# Patient Record
Sex: Female | Born: 1960
Health system: Southern US, Community
[De-identification: ages and names within clinical notes are randomized; demographics above are authoritative.]

## PROBLEM LIST (undated history)

## (undated) DIAGNOSIS — I2699 Other pulmonary embolism without acute cor pulmonale: Secondary | ICD-10-CM

## (undated) HISTORY — PX: ABDOMINAL HYSTERECTOMY: SHX81

---

## 2002-03-27 ENCOUNTER — Other Ambulatory Visit: Admission: RE | Admit: 2002-03-27 | Discharge: 2002-03-27 | Payer: Self-pay | Admitting: Obstetrics and Gynecology

## 2003-06-24 ENCOUNTER — Other Ambulatory Visit: Admission: RE | Admit: 2003-06-24 | Discharge: 2003-06-24 | Payer: Self-pay | Admitting: Obstetrics and Gynecology

## 2004-07-28 ENCOUNTER — Encounter (INDEPENDENT_AMBULATORY_CARE_PROVIDER_SITE_OTHER): Payer: Self-pay | Admitting: Specialist

## 2004-07-28 ENCOUNTER — Observation Stay (HOSPITAL_COMMUNITY): Admission: RE | Admit: 2004-07-28 | Discharge: 2004-07-29 | Payer: Self-pay | Admitting: Obstetrics and Gynecology

## 2004-10-16 ENCOUNTER — Encounter: Admission: RE | Admit: 2004-10-16 | Discharge: 2004-10-16 | Payer: Self-pay

## 2005-11-24 ENCOUNTER — Encounter: Admission: RE | Admit: 2005-11-24 | Discharge: 2005-11-24 | Payer: Self-pay | Admitting: Neurology

## 2006-01-19 ENCOUNTER — Emergency Department (HOSPITAL_COMMUNITY): Admission: EM | Admit: 2006-01-19 | Discharge: 2006-01-19 | Payer: Self-pay | Admitting: Emergency Medicine

## 2006-04-13 ENCOUNTER — Encounter (INDEPENDENT_AMBULATORY_CARE_PROVIDER_SITE_OTHER): Payer: Self-pay | Admitting: *Deleted

## 2006-04-13 ENCOUNTER — Ambulatory Visit (HOSPITAL_COMMUNITY): Admission: RE | Admit: 2006-04-13 | Discharge: 2006-04-13 | Payer: Self-pay | Admitting: *Deleted

## 2008-07-12 ENCOUNTER — Ambulatory Visit: Payer: Self-pay | Admitting: Sports Medicine

## 2008-07-12 DIAGNOSIS — M7750 Other enthesopathy of unspecified foot: Secondary | ICD-10-CM | POA: Insufficient documentation

## 2008-07-12 DIAGNOSIS — M2020 Hallux rigidus, unspecified foot: Secondary | ICD-10-CM | POA: Insufficient documentation

## 2008-07-15 ENCOUNTER — Encounter: Payer: Self-pay | Admitting: Sports Medicine

## 2008-07-31 ENCOUNTER — Ambulatory Visit: Payer: Self-pay | Admitting: Sports Medicine

## 2009-11-13 ENCOUNTER — Encounter: Admission: RE | Admit: 2009-11-13 | Discharge: 2009-11-13 | Payer: Self-pay | Admitting: Internal Medicine

## 2009-11-20 ENCOUNTER — Encounter: Admission: RE | Admit: 2009-11-20 | Discharge: 2009-11-20 | Payer: Self-pay | Admitting: Internal Medicine

## 2009-11-21 ENCOUNTER — Encounter: Admission: RE | Admit: 2009-11-21 | Discharge: 2009-11-21 | Payer: Self-pay | Admitting: Internal Medicine

## 2010-02-24 ENCOUNTER — Encounter: Admission: RE | Admit: 2010-02-24 | Discharge: 2010-02-24 | Payer: Self-pay | Admitting: Cardiovascular Disease

## 2010-08-16 ENCOUNTER — Encounter: Payer: Self-pay | Admitting: Internal Medicine

## 2010-12-11 NOTE — Discharge Summary (Signed)
Alicia Osborne, Alicia Osborne              ACCOUNT NO.:  [id number]   MEDICAL RECORD NO.:  [id number]          PATIENT TYPE:  OBV   LOCATION:  9305                          FACILITY:  [facility]   PHYSICIAN:  Guy Sandifer. Tomblin II, M.D.DATE OF BIRTH:  [birth year]   DATE OF ADMISSION:  07/28/2004  DATE OF DISCHARGE:  07/29/2004                                 DISCHARGE SUMMARY   ADMISSION DIAGNOSIS:  Menometrorrhagia.   DISCHARGE DIAGNOSIS:  Menometrorrhagia.   PROCEDURES:  On July 28, 2004, laparoscopically assisted vaginal  hysterectomy.   REASON FOR ADMISSION:  This patient is a 50 year old, married, white female,  G6, P3, with persistent heavy bleeding.  Details are dictated in the history  and physical.  She is admitted for surgical management.   HOSPITAL COURSE:  The patient is admitted to the hospital and undergoes the  above procedure.  On the evening of surgery, she is afebrile with stable  vital signs.  Urine output is clear.  She has good pain relief and had one  incidence of nausea and vomiting relieved with medication.  The abdomen is  soft and PAS hose are on.  She does receive Lovenox 40 mg daily as well.  On  the day of discharge, the white count is 9.2, hemoglobin 11.1 and platelet  count 199,000.  She is passing flatus, tolerating regular diet with good  pain relief and is ambulating well.  She remains afebrile and stable vital  signs.   CONDITION ON DISCHARGE:  Good.   DIET:  Regular as tolerated.   ACTIVITY:  No lifting.  No operation of automobiles.  No vaginal entry.   SPECIAL INSTRUCTIONS:  She is to call the office for problems, including,  but no limited to temperature of 101 degrees, persistent nausea and  vomiting, increasing pain or heavy vaginal bleeding.   DISCHARGE MEDICATIONS:  1.  Percocet 5/325 mg, #30, one to two p.o. q.6h. p.r.n.  2.  Ibuprofen 600 mg q.6h. p.r.n.  3.  Multivitamins daily.   FOLLOWUP:  Followup is in the office in two weeks.     Jame   JET/MEDQ  D:  07/29/2004  T:  07/29/2004  Job:  [id number]
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
## 2010-12-11 NOTE — Op Note (Signed)
NAMEANNE, BOLTZ                  ACCOUNT NO.:  [id number]   MEDICAL RECORD NO.:  [id number]          PATIENT TYPE:  AMB   LOCATION:  ENDO                         FACILITY:  [facility]   PHYSICIAN:  Georgiana Spinner, M.D.    DATE OF BIRTH:  06-28-1961   DATE OF PROCEDURE:  04/13/2006  DATE OF DISCHARGE:                                 OPERATIVE REPORT   PROCEDURE:  Colonoscopy.   INDICATIONS:  Rectal bleeding.   ANESTHESIA:  Fentanyl 100 mcg, Versed 15 mg, Phenergan 12.5 mg.   PROCEDURE:  With the patient mildly sedated in the left lateral decubitus  position, the Olympus videoscopic colonoscope was inserted into the rectum  and passed under direct vision to the cecum, identified by ileocecal valve  and appendiceal orifice both of which were photographed.  From this point,  the colonoscope was slowly withdrawn, taking circumferential views of  colonic mucosa as we withdrew all the way to the rectum.  We had seen a  polyp on the way in, and so we had to reinsert back to this level and  withdraw again, and we did find the polyp at the splenic flexure.  This was  photographed and was actually bleeding somewhat possibly due to the trauma  from the scope passage and suction, but nonetheless with washing, this  stopped and we were then able to ensnare this polyp, and using snare cautery  technique setting of 20/200 blended current, we removed the polyp and  suctioned it into the end of the endoscope and withdrew it to obtain the  pathology specimen.  The endoscope was then reinserted to this level and  withdrawn, taking circumferential views of the remaining colonic mucosa,  stopping once again in the rectum which appeared normal on direct and showed  hemorrhoids on retroflexed view.  The endoscope was straightened and  withdrawn.  The patient's vital signs and pulse oximeter remained stable.  The patient tolerated the procedure well and without apparent complication.   FINDINGS:  Polyp of the  splenic flexure, internal hemorrhoids otherwise  unremarkable exam.   PLAN:  Await biopsy report.  The patient will call me for results and follow-  up with me as an outpatient.           ______________________________  Georgiana Spinner, M.D.     GMO/MEDQ  D:  04/13/2006  T:  04/14/2006  Job:  [id number]

## 2010-12-11 NOTE — Op Note (Signed)
NAMEMOSSIE, GILDER              ACCOUNT NO.:  [id number]   MEDICAL RECORD NO.:  [id number]          PATIENT TYPE:  OBV   LOCATION:  9399                          FACILITY:  [facility]   PHYSICIAN:  Guy Sandifer. Tomblin II, M.D.DATE OF BIRTH:  Dec 23, 1960   DATE OF PROCEDURE:  07/28/2004  DATE OF DISCHARGE:                                 OPERATIVE REPORT   PREOPERATIVE DIAGNOSIS:  Menometrorrhagia.   POSTOPERATIVE DIAGNOSIS:  Menometrorrhagia.   PROCEDURE:  Laparoscopically assisted vaginal hysterectomy.   SURGEON:  Guy Sandifer. Henderson Cloud, M.D.   ASSISTANTStann Mainland. Vincente Poli, M.D.   ANESTHESIA:  General with endotracheal intubation.   ESTIMATED BLOOD LOSS:  100 cc.   SPECIMENS:  Uterus.   INDICATIONS AND CONSENT:  This patient is a 50 year old married white  female, G6, P3, with heavy bleeding.  Details are dictated in the history  and physical.  Laparoscopically assisted vaginal hysterectomy and removal a  tube and ovary only if distinctly abnormal has been discussed.  The  potential risks and complications were discussed preoperatively, including  but not limited to infection; bowel, bladder, ureteral damage; bleeding  requiring transfusion of blood products, possible transfusion reaction, HIV,  and hepatitis acquisition; DVT, PE, pneumonia; laparotomy; fistula  formation; and postoperative dyspareunia and pain.  All questions have been  answered, and consent is signed on the chart.   FINDINGS:  The upper abdomen is grossly normal.  The uterus is about six  weeks in size.  Anterior and posterior cul-de-sacs are normal.  The tubes  and ovaries are normal.   DESCRIPTION OF PROCEDURE:  The patient is taken to the operating room where  she is identified and placed in the dorsal supine position, and general  anesthesia is induced via endotracheal intubation.  She is then placed in  the dorsal lithotomy position where she is prepped abdominally and  vaginally.  The bladder is straight  catheterized.  A Hulka tenaculum is  placed in the uterus as a manipulator, and she is draped in a sterile  fashion.   A small infraumbilical incision is made, and a disposable Veress needle is  placed.  Syringe and drop test are normal.  Two liters of gas are  insufflated under low pressure.  Good tympany is noted in the right upper  quadrant.  The Veress needle is removed and is replaced with a 10/11  disposable trocar sleeve.  Placement is verified with the laparoscope, and  no damage to the surrounding structures is noted.  A small suprapubic  incision is made in the midline, and a 5-mm disposable trocar sleeve is  placed under direct visualization without difficulty.  The above findings  are noted.  The proximal ligaments are taken down bilaterally to the level  of the vesicouterine peritoneum with the Gyrus bipolar cautery cutting  instrument.  Excellent hemostasis is maintained.  The vesicouterine  peritoneum is incised in the midline, hydrodissected, and taken down  cephalolaterally.  The instruments are removed.  The suprapubic trocar  sleeve is removed, and attention is turned to the vagina.   The posterior cul-de-sac is entered  sharply, and the cervix is circumscribed  with unipolar cautery.  The mucosa is advanced sharply and bluntly.  The  uterosacral ligaments are taken bilaterally with the Gyrus bipolar  instrument.  The anterior cul-de-sac is entered without difficulty.  Progressive bites of the bladder pillars, cardinal ligaments, and uterine  vessels are taken bilaterally.  The fundus is delivered posteriorly.  The  proximal ligaments are taken down, and the specimen is delivered.  The  uterosacral ligaments are plicated to the vaginal cuff bilaterally with 0  Monocryl suture.  They are then reapproximated in the midline with a  separate suture.  The cuff is closed with figure-of-eight sutures.  A Foley  catheter is placed in the bladder as a drain, and clear urine is  noted.  Attention is returned to the abdomen.   Pneumoperitoneum is reintroduced, and under direct visualization, the  suprapubic trocar sleeve is reintroduced.  Inspection and thoroughly  irrigation reveals minor bleeding at peritoneal edges which is controlled  easily with bipolar cautery.  Excess fluid is removed.  Inspection under  reduced pneumoperitoneum reveals complete hemostasis.  All instruments are  removed.  A 0 Vicryl is used to pull together the subcutaneous tissue and  the umbilical incision.  Both incisions are then injected with 0.5% plain  Marcaine, and Dermabond is used to close the skin on both incisions.  All  counts are correct.   The patient is awakened and taken to the recovery room in stable condition.     Jame   JET/MEDQ  D:  07/28/2004  T:  07/28/2004  Job:  [id number]

## 2011-07-06 ENCOUNTER — Ambulatory Visit (INDEPENDENT_AMBULATORY_CARE_PROVIDER_SITE_OTHER): Payer: 59 | Admitting: Sports Medicine

## 2011-07-06 DIAGNOSIS — M2020 Hallux rigidus, unspecified foot: Secondary | ICD-10-CM

## 2011-07-06 DIAGNOSIS — M7750 Other enthesopathy of unspecified foot: Secondary | ICD-10-CM

## 2011-07-06 NOTE — Progress Notes (Signed)
  Subjective:    Patient ID: Alicia Osborne, female    DOB: 04-Mar-1961, 50 y.o.   MRN: [id number]  HPI Pt came in today needing more green insoles for her shoes. The insoles we gave previously helped out a lot and pt is back running and walk before her previous foot pain issue. Also placed MT pads on several of her everyday dress shoes per her request.    Review of Systems     Objective:   Physical Exam        Assessment & Plan:

## 2011-07-12 ENCOUNTER — Encounter: Payer: Self-pay | Admitting: Emergency Medicine

## 2011-07-12 ENCOUNTER — Emergency Department (HOSPITAL_COMMUNITY)
Admission: EM | Admit: 2011-07-12 | Discharge: 2011-07-12 | Disposition: A | Discharge status: Home / Self Care | Payer: 59 | Attending: Emergency Medicine | Admitting: Emergency Medicine

## 2011-07-12 ENCOUNTER — Emergency Department (HOSPITAL_COMMUNITY): Payer: 59

## 2011-07-12 DIAGNOSIS — S139XXA Sprain of joints and ligaments of unspecified parts of neck, initial encounter: Secondary | ICD-10-CM | POA: Insufficient documentation

## 2011-07-12 DIAGNOSIS — S161XXA Strain of muscle, fascia and tendon at neck level, initial encounter: Secondary | ICD-10-CM

## 2011-07-12 DIAGNOSIS — R42 Dizziness and giddiness: Secondary | ICD-10-CM | POA: Insufficient documentation

## 2011-07-12 DIAGNOSIS — M542 Cervicalgia: Secondary | ICD-10-CM | POA: Insufficient documentation

## 2011-07-12 DIAGNOSIS — Z86718 Personal history of other venous thrombosis and embolism: Secondary | ICD-10-CM | POA: Insufficient documentation

## 2011-07-12 DIAGNOSIS — X58XXXA Exposure to other specified factors, initial encounter: Secondary | ICD-10-CM | POA: Insufficient documentation

## 2011-07-12 DIAGNOSIS — Z7901 Long term (current) use of anticoagulants: Secondary | ICD-10-CM | POA: Insufficient documentation

## 2011-07-12 DIAGNOSIS — M5020 Other cervical disc displacement, unspecified cervical region: Secondary | ICD-10-CM

## 2011-07-12 HISTORY — DX: Other pulmonary embolism without acute cor pulmonale: I26.99

## 2011-07-12 MED ORDER — DEXAMETHASONE SODIUM PHOSPHATE 10 MG/ML IJ SOLN
10.0000 mg | Freq: Once | INTRAMUSCULAR | Status: AC
  Administered 2011-07-12: 10 mg via INTRAMUSCULAR
  Filled 2011-07-12: qty 1

## 2011-07-12 MED ORDER — OXYCODONE-ACETAMINOPHEN 5-325 MG PO TABS: 2.0000 | ORAL_TABLET | Freq: Four times a day (QID) | ORAL | Status: AC | PRN

## 2011-07-12 MED ORDER — CYCLOBENZAPRINE HCL 10 MG PO TABS: 10.0000 mg | ORAL_TABLET | Freq: Two times a day (BID) | ORAL | Status: AC | PRN

## 2011-07-12 NOTE — ED Notes (Signed)
Patient transported to CT 

## 2011-07-12 NOTE — ED Notes (Signed)
Pt states pain in neck onset last pm, denies injury or heavy lifting, pt states was worse this am, and had near syncopal episode this am from the pain after lifting arms to reach in cabinet.

## 2011-07-12 NOTE — ED Provider Notes (Signed)
History     CSN: [id number] Arrival date & time: 07/12/2011  8:10 AM   First MD Initiated Contact with Patient 07/12/11 0825      Chief Complaint  Patient presents with  . Neck Pain    (Consider location/radiation/quality/duration/timing/severity/associated sxs/prior treatment) HPI Comments: Pt woke up this am with severe pain to left side of neck.  Worse with movement of head.  No radiation down arms.  No numbness/tingling/weakness to arms.  Went to get some tylenol and when she lifted her arms/head up to open the cabinet, got dizzy/faint feeling.  No syncope.  Did not hit her head.  No recent injury/trauma.  No CP/SOB/dizziness (other than one episode when she lifted her arms up).  No recent illnesses.  Is on coumadin for DVT/PE.  Patient is a 50 y.o. female presenting with neck pain. The history is provided by the patient.  Neck Pain  This is a new problem. The current episode started 3 to 5 hours ago. The problem occurs constantly. Pertinent negatives include no chest pain, no numbness, no headaches and no weakness.    Past Medical History  Diagnosis Date  . Pulmonary embolism     Past Surgical History  Procedure Date  . Cesarean section   . Abdominal hysterectomy     History reviewed. No pertinent family history.  History  Substance Use Topics  . Smoking status: Not on file  . Smokeless tobacco: Not on file  . Alcohol Use: Yes    OB History    Grav Para Term Preterm Abortions TAB SAB Ect Mult Living                  Review of Systems  Constitutional: Negative for fever, chills, diaphoresis and fatigue.  HENT: Positive for neck pain. Negative for congestion, rhinorrhea and sneezing.   Eyes: Negative.   Respiratory: Negative for cough, chest tightness and shortness of breath.   Cardiovascular: Negative for chest pain and leg swelling.  Gastrointestinal: Negative for nausea, vomiting, abdominal pain, diarrhea and blood in stool.  Genitourinary: Negative for  frequency, hematuria, flank pain and difficulty urinating.  Musculoskeletal: Negative for back pain and arthralgias.  Skin: Negative for rash.  Neurological: Positive for light-headedness. Negative for dizziness, speech difficulty, weakness, numbness and headaches.    Allergies  Aspirin; Hydromorphone hcl; and Ibuprofen  Home Medications   Current Outpatient Rx  Name Route Sig Dispense Refill  . ACETAMINOPHEN 500 MG PO TABS Oral Take 1,000 mg by mouth every 6 (six) hours as needed. For pain.     Marland Kitchen HYDROCHLOROTHIAZIDE 50 MG PO TABS Oral Take 50 mg by mouth daily.      . WARFARIN SODIUM 6 MG PO TABS Oral Take 6 mg by mouth daily.      . CYCLOBENZAPRINE HCL 10 MG PO TABS Oral Take 1 tablet (10 mg total) by mouth 2 (two) times daily as needed for muscle spasms. 20 tablet 0  . OXYCODONE-ACETAMINOPHEN 5-325 MG PO TABS Oral Take 2 tablets by mouth every 6 (six) hours as needed for pain. 30 tablet 0    BP 113/81  Pulse 94  Temp(Src) 97.8 F (36.6 C) (Oral)  Resp 16  SpO2 100%  Physical Exam  Constitutional: She is oriented to person, place, and time. She appears well-developed and well-nourished.  HENT:  Head: Normocephalic and atraumatic.  Eyes: Pupils are equal, round, and reactive to light.  Neck:       No pain over cervical spine.  Point  tenderness over left lateral C2/C3 area and along left trapezius  Cardiovascular: Normal rate, regular rhythm and normal heart sounds.   Pulmonary/Chest: Effort normal and breath sounds normal. No respiratory distress. She has no wheezes. She has no rales. She exhibits no tenderness.  Abdominal: Soft. Bowel sounds are normal. There is no tenderness. There is no rebound and no guarding.  Musculoskeletal: Normal range of motion. She exhibits no edema.  Lymphadenopathy:    She has no cervical adenopathy.  Neurological: She is alert and oriented to person, place, and time. She has normal strength. No cranial nerve deficit or sensory deficit.  Coordination normal.  Skin: Skin is warm and dry. No rash noted.  Psychiatric: She has a normal mood and affect.    ED Course  Procedures (including critical care time)  Labs Reviewed - No data to display Mr Cervical Spine Wo Contrast  07/12/2011  *RADIOLOGY REPORT*  Clinical Data: Neck pain and stiffness.  MRI CERVICAL SPINE WITHOUT CONTRAST  Technique:  Multiplanar and multiecho pulse sequences of the cervical spine, to include the craniocervical junction and cervicothoracic junction, were obtained according to standard protocol without intravenous contrast.  Comparison: None.  Findings: The craniocervical junction appears unremarkable.  No significant vertebral subluxation.  Degenerative endplate findings at C4-5 noted.  There is also mild loss of disc height that this level.  Inversion recovery weighted images demonstrate no significant abnormal vertebral or periligamentous edema.  Additional findings at individual levels are as follows:  C2-3:  Unremarkable.  C3-4:  Minimal disc bulge.  No impingement.  C4-5:  Diffuse disc bulge noted with bilateral uncinate spurring. There is borderline left foraminal stenosis. The AP diameter of the thecal sac is 10 mm, borderline for central stenosis.  C5-6:  Mild disc bulge noted with bilateral uncinate spurring resulting in moderate bilateral foraminal stenosis. The AP diameter of the thecal sac is 9 mm, compatible with mild central stenosis.  C6-7:  Broad left paracentral disc protrusion noted, with borderline left foraminal stenosis.  C7-T1:  Unremarkable.  IMPRESSION:  1.  Cervical spondylosis and degenerative disc disease, with impingement at the C5-6 level.  Original Report Authenticated By: Dellia Cloud, M.D.     1. Neck strain   2. HNP (herniated nucleus pulposus), cervical    Discussed with her husband who is a physician, will check MRI No results found for this or any previous visit. Mr Cervical Spine Wo Contrast  07/12/2011   *RADIOLOGY REPORT*  Clinical Data: Neck pain and stiffness.  MRI CERVICAL SPINE WITHOUT CONTRAST  Technique:  Multiplanar and multiecho pulse sequences of the cervical spine, to include the craniocervical junction and cervicothoracic junction, were obtained according to standard protocol without intravenous contrast.  Comparison: None.  Findings: The craniocervical junction appears unremarkable.  No significant vertebral subluxation.  Degenerative endplate findings at C4-5 noted.  There is also mild loss of disc height that this level.  Inversion recovery weighted images demonstrate no significant abnormal vertebral or periligamentous edema.  Additional findings at individual levels are as follows:  C2-3:  Unremarkable.  C3-4:  Minimal disc bulge.  No impingement.  C4-5:  Diffuse disc bulge noted with bilateral uncinate spurring. There is borderline left foraminal stenosis. The AP diameter of the thecal sac is 10 mm, borderline for central stenosis.  C5-6:  Mild disc bulge noted with bilateral uncinate spurring resulting in moderate bilateral foraminal stenosis. The AP diameter of the thecal sac is 9 mm, compatible with mild central stenosis.  C6-7:  Broad left paracentral disc protrusion noted, with borderline left foraminal stenosis.  C7-T1:  Unremarkable.  IMPRESSION:  1.  Cervical spondylosis and degenerative disc disease, with impingement at the C5-6 level.  Original Report Authenticated By: Dellia Cloud, M.D.      MDM  Sound MS, no neuro deficits, no recent trauma.  Feel that near syncope was likely pain related.  No other symptoms to suggest cardiac/other etiology        Rolan Bucco, MD 07/12/11 1058

## 2011-07-12 NOTE — ED Notes (Signed)
Pt returned from MRI NAD.

## 2012-03-14 ENCOUNTER — Other Ambulatory Visit: Payer: Self-pay | Admitting: Obstetrics and Gynecology

## 2013-05-03 ENCOUNTER — Telehealth: Payer: Self-pay | Admitting: Oncology

## 2013-05-03 NOTE — Telephone Encounter (Signed)
REFERRAL FORWARD TO MD FOR REVIEW.

## 2013-05-11 ENCOUNTER — Ambulatory Visit: Payer: 59

## 2013-05-11 ENCOUNTER — Telehealth: Payer: Self-pay | Admitting: Oncology

## 2013-05-11 ENCOUNTER — Encounter: Payer: 59 | Admitting: Oncology
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
## 2013-05-11 NOTE — Telephone Encounter (Signed)
C/D 05/11/13 for appt. 06/06/13

## 2013-06-06 ENCOUNTER — Encounter: Payer: 59 | Admitting: Oncology

## 2013-06-06 ENCOUNTER — Ambulatory Visit: Payer: 59

## 2013-06-20 ENCOUNTER — Other Ambulatory Visit: Payer: Self-pay | Admitting: Gastroenterology

## 2013-09-10 ENCOUNTER — Ambulatory Visit
Admission: RE | Admit: 2013-09-10 | Discharge: 2013-09-10 | Disposition: A | Discharge status: Home / Self Care | Payer: BC Managed Care – PPO | Source: Ambulatory Visit | Attending: Internal Medicine | Admitting: Internal Medicine

## 2013-09-10 ENCOUNTER — Other Ambulatory Visit: Payer: Self-pay | Admitting: Internal Medicine

## 2013-09-10 DIAGNOSIS — S0990XA Unspecified injury of head, initial encounter: Secondary | ICD-10-CM

## 2014-09-11 ENCOUNTER — Other Ambulatory Visit: Payer: Self-pay | Admitting: Obstetrics and Gynecology

## 2014-09-12 LAB — CYTOLOGY - PAP

## 2017-04-01 DIAGNOSIS — H04222 Epiphora due to insufficient drainage, left lacrimal gland: Secondary | ICD-10-CM | POA: Diagnosis not present

## 2017-04-01 DIAGNOSIS — H16103 Unspecified superficial keratitis, bilateral: Secondary | ICD-10-CM | POA: Diagnosis not present

## 2017-04-01 DIAGNOSIS — H04123 Dry eye syndrome of bilateral lacrimal glands: Secondary | ICD-10-CM | POA: Diagnosis not present

## 2017-04-01 DIAGNOSIS — H10413 Chronic giant papillary conjunctivitis, bilateral: Secondary | ICD-10-CM | POA: Diagnosis not present

## 2017-04-14 DIAGNOSIS — H01115 Allergic dermatitis of left lower eyelid: Secondary | ICD-10-CM | POA: Diagnosis not present

## 2017-04-14 DIAGNOSIS — H04222 Epiphora due to insufficient drainage, left lacrimal gland: Secondary | ICD-10-CM | POA: Diagnosis not present

## 2017-04-14 DIAGNOSIS — H10412 Chronic giant papillary conjunctivitis, left eye: Secondary | ICD-10-CM | POA: Diagnosis not present

## 2017-07-05 DIAGNOSIS — L258 Unspecified contact dermatitis due to other agents: Secondary | ICD-10-CM | POA: Diagnosis not present

## 2017-08-08 DIAGNOSIS — Z1231 Encounter for screening mammogram for malignant neoplasm of breast: Secondary | ICD-10-CM | POA: Diagnosis not present

## 2017-08-08 DIAGNOSIS — Z6827 Body mass index (BMI) 27.0-27.9, adult: Secondary | ICD-10-CM | POA: Diagnosis not present

## 2017-08-08 DIAGNOSIS — Z01419 Encounter for gynecological examination (general) (routine) without abnormal findings: Secondary | ICD-10-CM | POA: Diagnosis not present

## 2017-11-10 DIAGNOSIS — H04123 Dry eye syndrome of bilateral lacrimal glands: Secondary | ICD-10-CM | POA: Diagnosis not present

## 2017-11-10 DIAGNOSIS — H04412 Chronic dacryocystitis of left lacrimal passage: Secondary | ICD-10-CM | POA: Diagnosis not present

## 2017-12-01 DIAGNOSIS — B308 Other viral conjunctivitis: Secondary | ICD-10-CM | POA: Diagnosis not present

## 2017-12-12 DIAGNOSIS — H01025 Squamous blepharitis left lower eyelid: Secondary | ICD-10-CM | POA: Diagnosis not present

## 2017-12-12 DIAGNOSIS — H01022 Squamous blepharitis right lower eyelid: Secondary | ICD-10-CM | POA: Diagnosis not present

## 2017-12-12 DIAGNOSIS — H01021 Squamous blepharitis right upper eyelid: Secondary | ICD-10-CM | POA: Diagnosis not present

## 2017-12-12 DIAGNOSIS — H01024 Squamous blepharitis left upper eyelid: Secondary | ICD-10-CM | POA: Diagnosis not present

## 2018-01-16 DIAGNOSIS — H01021 Squamous blepharitis right upper eyelid: Secondary | ICD-10-CM | POA: Diagnosis not present

## 2018-01-16 DIAGNOSIS — H01025 Squamous blepharitis left lower eyelid: Secondary | ICD-10-CM | POA: Diagnosis not present

## 2018-01-16 DIAGNOSIS — H01024 Squamous blepharitis left upper eyelid: Secondary | ICD-10-CM | POA: Diagnosis not present

## 2018-01-16 DIAGNOSIS — H01022 Squamous blepharitis right lower eyelid: Secondary | ICD-10-CM | POA: Diagnosis not present

## 2018-02-04 ENCOUNTER — Ambulatory Visit (INDEPENDENT_AMBULATORY_CARE_PROVIDER_SITE_OTHER): Payer: BLUE CROSS/BLUE SHIELD

## 2018-02-04 ENCOUNTER — Encounter (HOSPITAL_COMMUNITY): Payer: Self-pay | Admitting: Emergency Medicine

## 2018-02-04 ENCOUNTER — Ambulatory Visit (HOSPITAL_COMMUNITY)
Admission: EM | Admit: 2018-02-04 | Discharge: 2018-02-04 | Disposition: A | Discharge status: Home / Self Care | Payer: BLUE CROSS/BLUE SHIELD | Attending: Internal Medicine | Admitting: Internal Medicine

## 2018-02-04 DIAGNOSIS — M79671 Pain in right foot: Secondary | ICD-10-CM

## 2018-02-04 DIAGNOSIS — S92351A Displaced fracture of fifth metatarsal bone, right foot, initial encounter for closed fracture: Secondary | ICD-10-CM | POA: Diagnosis not present

## 2018-02-04 DIAGNOSIS — S92354A Nondisplaced fracture of fifth metatarsal bone, right foot, initial encounter for closed fracture: Secondary | ICD-10-CM | POA: Diagnosis not present

## 2018-02-04 MED ORDER — NAPROXEN 500 MG PO TABS: 500.0000 mg | ORAL_TABLET | Freq: Two times a day (BID) | ORAL | 0 refills | Status: AC

## 2018-02-04 NOTE — Discharge Instructions (Signed)
Rest elevate and ice to affected extremity

## 2018-02-04 NOTE — ED Provider Notes (Addendum)
[facility]    CSN: [id number] Arrival date & time: 02/04/18  1243     History   Chief Complaint Chief Complaint  Patient presents with  . Foot Pain    HPI Alicia Osborne is a 57 y.o. female.   57 y.o. female presents with injuries to right foot that occurred today when she missed a step on a ladder. Patient denies any additional injures. Foot is warm dry cap refill, < 2 swelling noted to dorsal aspect of left foot and lateral aspect of foot right foor Ice applied to affected extemity Condition is acute in nature. Condition is made better by nothing. Condition is made worse by weight bearing activity. Patient denies any treatment prior to there arrival at this facility. Patient denies narcotic pain medication or crutch prescription at this time.       Past Medical History:  Diagnosis Date  . Pulmonary embolism [facility])     Patient Active Problem List   Diagnosis Date Noted  . METATARSALGIA 07/12/2008  . HALLUX RIGIDUS 07/12/2008    Past Surgical History:  Procedure Laterality Date  . ABDOMINAL HYSTERECTOMY    . CESAREAN SECTION      OB History   None      Home Medications    Prior to Admission medications   Medication Sig Start Date End Date Taking? Authorizing Provider  hydrochlorothiazide (HYDRODIURIL) 50 MG tablet Take 50 mg by mouth daily.     Yes [provider]  acetaminophen (TYLENOL) 500 MG tablet Take 1,000 mg by mouth every 6 (six) hours as needed. For pain.     [provider]  warfarin (COUMADIN) 6 MG tablet Take 6 mg by mouth daily.      [provider]    Family History No family history on file.  Social History Social History   Tobacco Use  . Smoking status: Not on file  Substance Use Topics  . Alcohol use: Yes  . Drug use: No     Allergies   Aspirin; Hydromorphone hcl; and Ibuprofen   Review of Systems Review of Systems  Constitutional: Negative for chills and fever.  HENT: Negative  for ear pain and sore throat.   Eyes: Negative for pain and visual disturbance.  Respiratory: Negative for cough and shortness of breath.   Cardiovascular: Negative for chest pain and palpitations.  Gastrointestinal: Negative for abdominal pain and vomiting.  Genitourinary: Negative for dysuria and hematuria.  Musculoskeletal: Negative for arthralgias and back pain.       Pain to right foot.  Skin: Negative for color change and rash.  Neurological: Negative for seizures and syncope.  All other systems reviewed and are negative.    Physical Exam Triage Vital Signs ED Triage Vitals [02/04/18 1403]  Enc Vitals Group     BP (!) 163/93     Pulse Rate (!) 111     Resp 18     Temp 98 F (36.7 C)     Temp src      SpO2 100 %     Weight      Height      Head Circumference      Peak Flow      Pain Score 10     Pain Loc      Pain Edu?      Excl. in GC?    No data found.  Updated Vital Signs BP (!) 163/93   Pulse (!) 111   Temp 98  F (36.7 C)   Resp 18   SpO2 100%   Visual Acuity Right Eye Distance:   Left Eye Distance:   Bilateral Distance:    Right Eye Near:   Left Eye Near:    Bilateral Near:     Physical Exam  Constitutional: She is oriented to person, place, and time. She appears well-developed and well-nourished.  HENT:  Head: Normocephalic and atraumatic.  Eyes: Conjunctivae are normal.  Neck: Normal range of motion.  Pulmonary/Chest: Effort normal.  Musculoskeletal: She exhibits edema ( to right foot) and tenderness.  Neurological: She is alert and oriented to person, place, and time.  Skin: Skin is warm and dry. No erythema.  Psychiatric: She has a normal mood and affect.  Nursing note and vitals reviewed.    UC Treatments / Results  Labs (all labs ordered are listed, but only abnormal results are displayed) Labs Reviewed - No data to display  EKG None  Radiology Dg Ankle Complete Right  Result Date: 02/04/2018 CLINICAL DATA:  Patient states  that she fell from step ladder today injuring her right foot and ankle. Pain along lateral side of foot and lateral side of ankle, pain and swelling. Hx of right foot fractures EXAM: RIGHT ANKLE - COMPLETE 3+ VIEW COMPARISON:  None. FINDINGS: Small slivers of bone are noted along the inferior margin of the distal fibula, which may reflect acute avulsion fractures be chronic. There is a transverse fracture across the base of the fifth metatarsal, acute. No other evidence of a fracture. The ankle mortise is normally spaced and aligned. Mild lateral soft tissue swelling. IMPRESSION: 1. Acute fracture at the base of the fifth metatarsal. 2. Possible acute small avulsion fractures from the inferior margin of the distal fibula, versus chronic changes. Electronically Signed   By: Amie Portlandavid  Ormond M.D.   On: 02/04/2018 14:53   Dg Foot Complete Right  Result Date: 02/04/2018 CLINICAL DATA:  Patient states that she fell from step ladder today injuring her right foot and ankle. Pain along lateral side of foot and lateral side of ankle, pain and swelling. Hx of right foot fractures EXAM: RIGHT FOOT COMPLETE - 3+ VIEW COMPARISON:  None. FINDINGS: There is an acute fracture at the base of the fifth metatarsal. There is a transverse fracture that extends across the metaphysis. A secondary fracture line extends to the articular surface with the cuboid. There is no fracture displacement or angulation. Deformity of the distal shaft of the fifth metatarsal reflects an old, healed fracture. No other fractures. Mild first metatarsophalangeal joint osteoarthritis. Mild lateral soft tissue swelling. IMPRESSION: Acute nondisplaced fracture at the base of the right fifth metatarsal as described. No dislocation. Electronically Signed   By: Amie Portlandavid  Ormond M.D.   On: 02/04/2018 14:54    Procedures Procedures (including critical care time)  Medications Ordered in UC Medications - No data to display  Initial Impression / Assessment and  Plan / UC Course  I have reviewed the triage vital signs and the nursing notes.  Pertinent labs & imaging results that were available during my care of the patient were reviewed by me and considered in my medical decision making (see chart for details).      Final Clinical Impressions(s) / UC Diagnoses   Final diagnoses:  None   Discharge Instructions   None    ED Prescriptions    None     Controlled Substance Prescriptions [place] Controlled Substance Registry consulted? Not Applicable   Alene Miresmohundro, Taelor Moncada C, NP  02/04/18 1531    Alene Mires, NP 02/04/18 [phone]

## 2018-02-04 NOTE — ED Triage Notes (Signed)
Pt misstepped off the ladder today, twisted the bottom of her R foot and ankle.

## 2018-03-30 DIAGNOSIS — Z23 Encounter for immunization: Secondary | ICD-10-CM | POA: Diagnosis not present

## 2018-04-03 DIAGNOSIS — S93601A Unspecified sprain of right foot, initial encounter: Secondary | ICD-10-CM | POA: Diagnosis not present

## 2018-06-07 DIAGNOSIS — Z1212 Encounter for screening for malignant neoplasm of rectum: Secondary | ICD-10-CM | POA: Diagnosis not present

## 2018-06-07 DIAGNOSIS — Z1211 Encounter for screening for malignant neoplasm of colon: Secondary | ICD-10-CM | POA: Diagnosis not present

## 2018-07-07 DIAGNOSIS — R2230 Localized swelling, mass and lump, unspecified upper limb: Secondary | ICD-10-CM | POA: Diagnosis not present

## 2018-08-24 DIAGNOSIS — R05 Cough: Secondary | ICD-10-CM | POA: Diagnosis not present

## 2018-08-24 DIAGNOSIS — K219 Gastro-esophageal reflux disease without esophagitis: Secondary | ICD-10-CM | POA: Diagnosis not present

## 2018-08-24 DIAGNOSIS — R9389 Abnormal findings on diagnostic imaging of other specified body structures: Secondary | ICD-10-CM | POA: Diagnosis not present

## 2018-08-25 DIAGNOSIS — R05 Cough: Secondary | ICD-10-CM | POA: Diagnosis not present

## 2018-12-05 DIAGNOSIS — Z0000 Encounter for general adult medical examination without abnormal findings: Secondary | ICD-10-CM | POA: Diagnosis not present

## 2018-12-07 DIAGNOSIS — K219 Gastro-esophageal reflux disease without esophagitis: Secondary | ICD-10-CM | POA: Diagnosis not present

## 2018-12-07 DIAGNOSIS — Z780 Asymptomatic menopausal state: Secondary | ICD-10-CM | POA: Diagnosis not present

## 2018-12-07 DIAGNOSIS — Z86711 Personal history of pulmonary embolism: Secondary | ICD-10-CM | POA: Diagnosis not present

## 2018-12-07 DIAGNOSIS — Z0000 Encounter for general adult medical examination without abnormal findings: Secondary | ICD-10-CM | POA: Diagnosis not present

## 2019-02-05 IMAGING — DX DG FOOT COMPLETE 3+V*R*
3 series · 3 of 3 positions shown · non-contrast
Comparison: None.

CLINICAL DATA: Patient states that she fell from step ladder today
injuring her right foot and ankle. Pain along lateral side of foot
and lateral side of ankle, pain and swelling. Hx of right foot
fractures

EXAM:
RIGHT FOOT COMPLETE - 3+ VIEW

[foot ap]
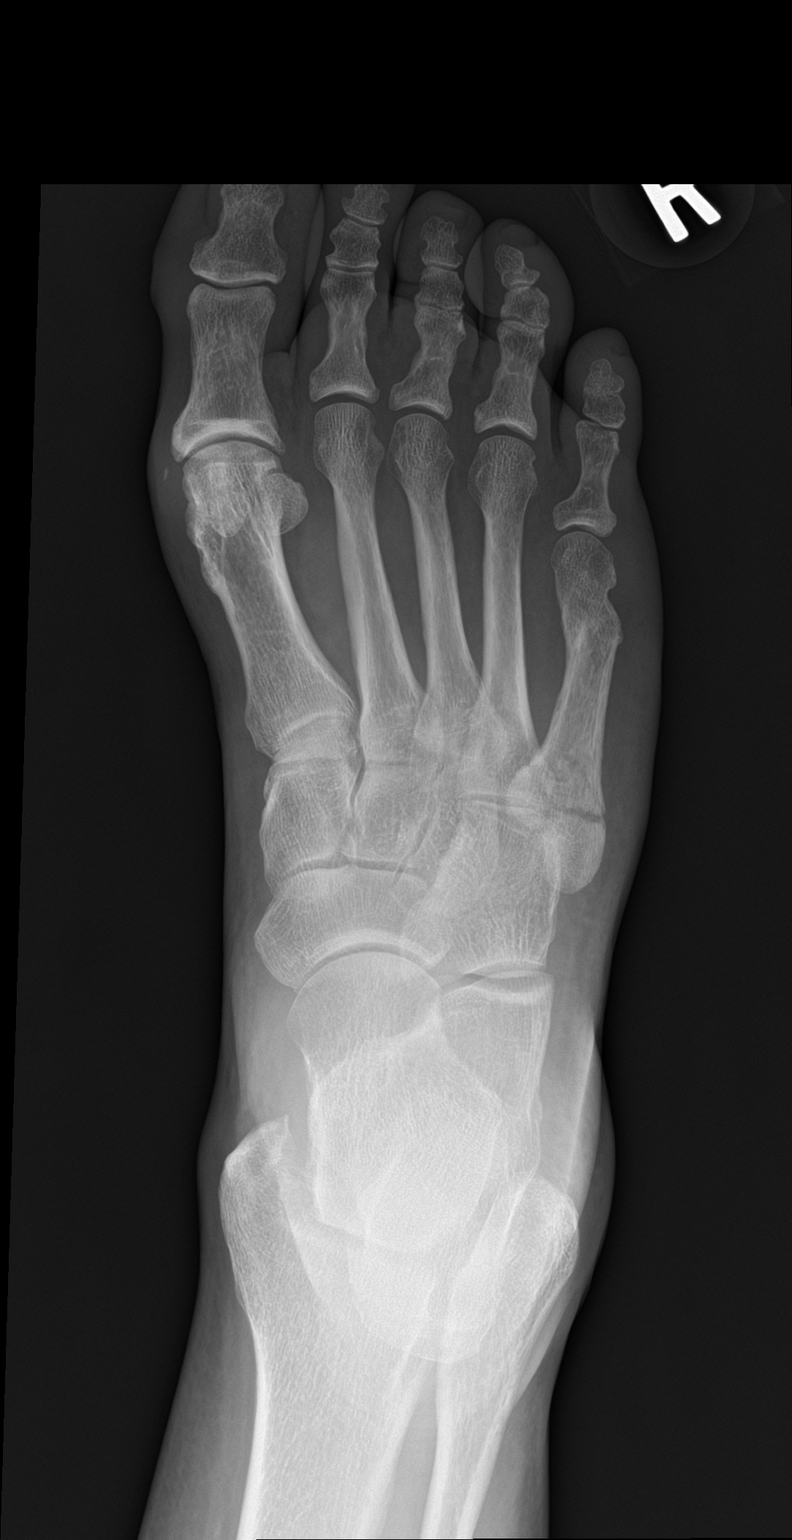

[foot obl]
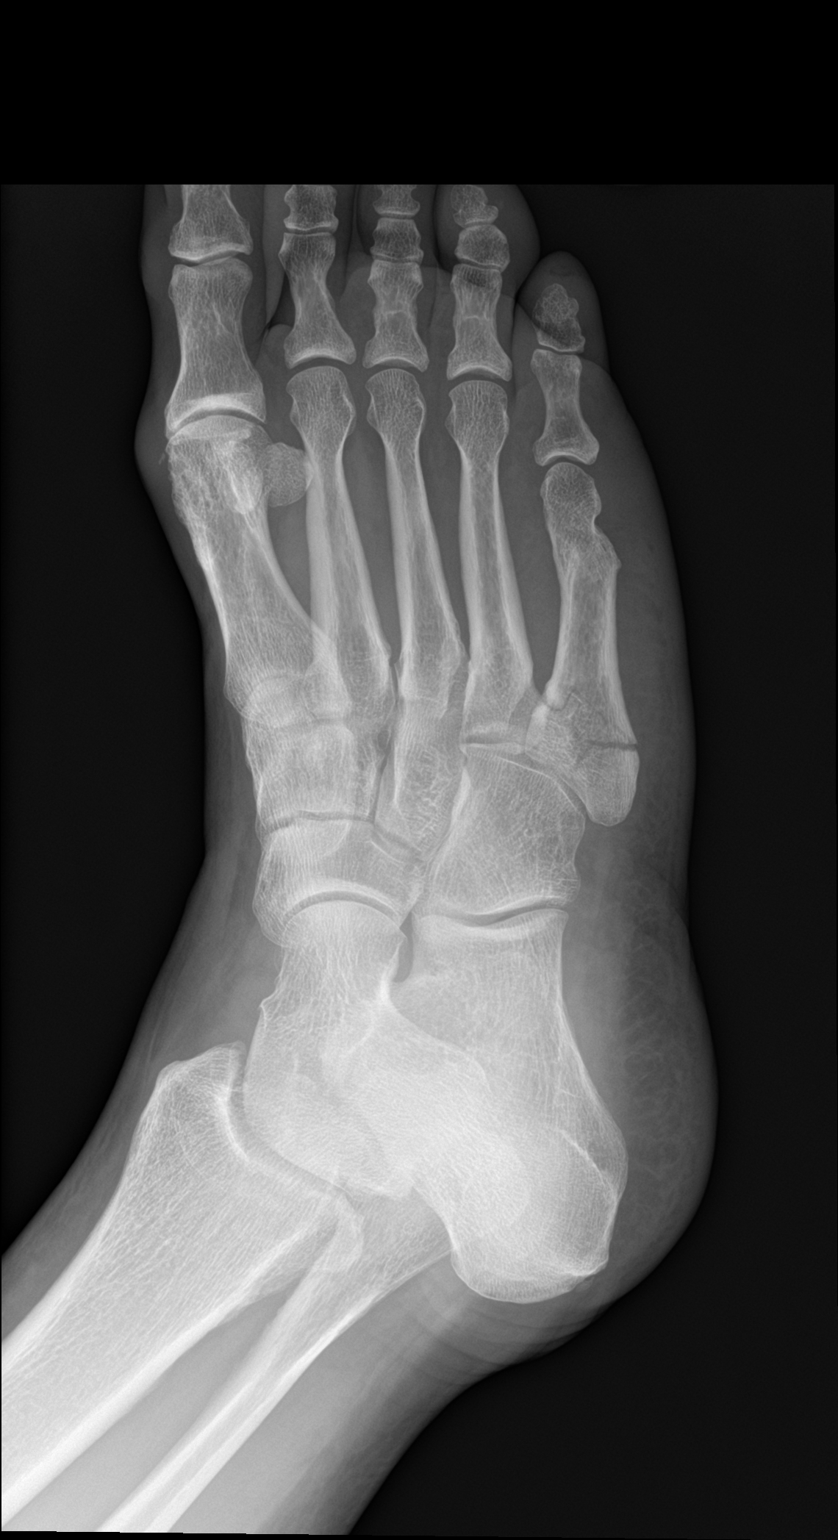

[foot lat]
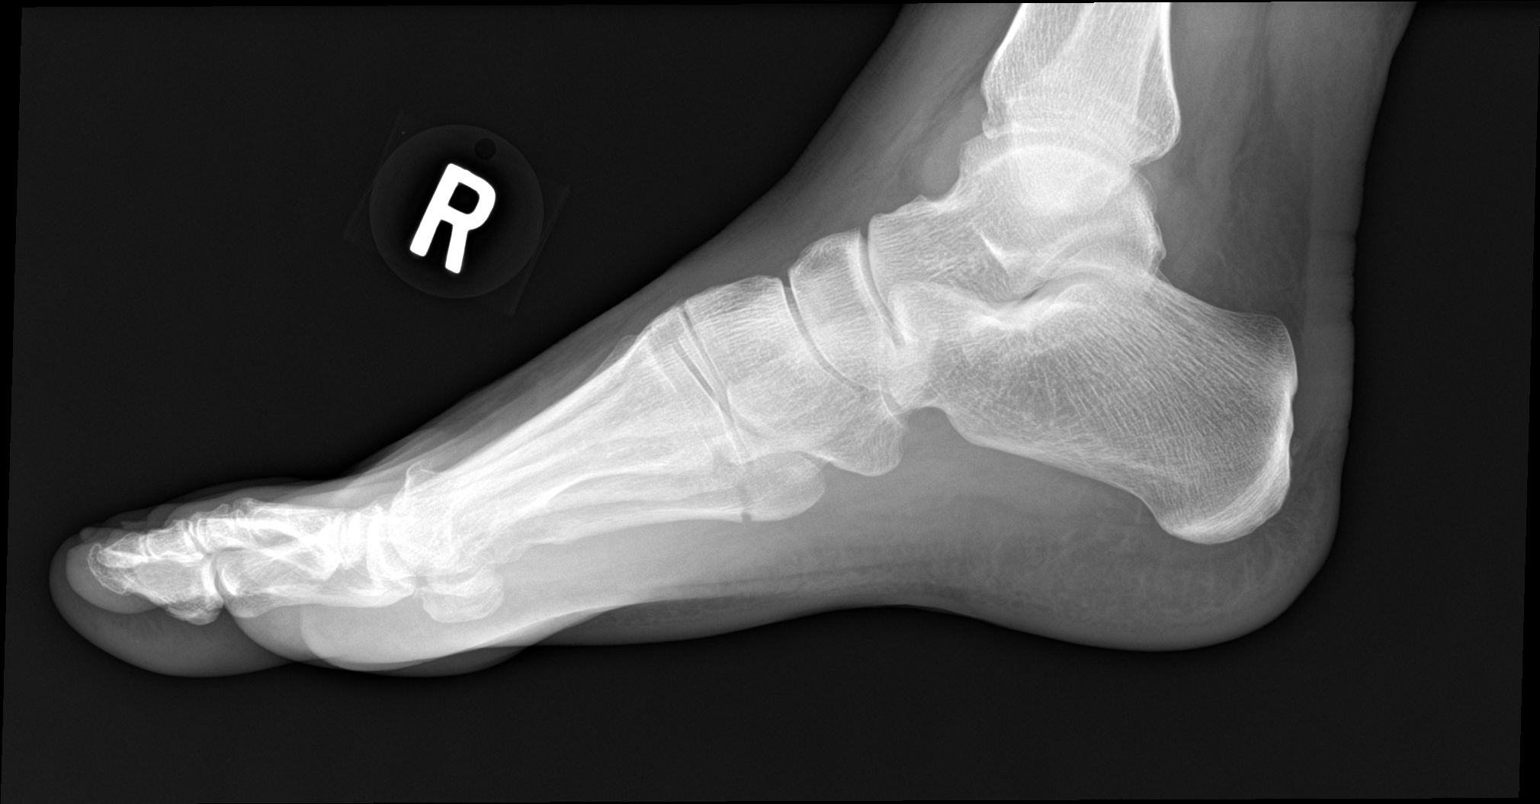

[3 of 3 positions shown; findings below may reference images not displayed]

FINDINGS: There is an acute fracture at the base of the fifth metatarsal.
There is a transverse fracture that extends across the metaphysis. A
secondary fracture line extends to the articular surface with the
cuboid. There is no fracture displacement or angulation. Deformity
of the distal shaft of the fifth metatarsal reflects an old, healed
fracture.

No other fractures. Mild first metatarsophalangeal joint
osteoarthritis.

Mild lateral soft tissue swelling.
IMPRESSION: Acute nondisplaced fracture at the base of the right fifth
metatarsal as described. No dislocation.

## 2019-02-14 DIAGNOSIS — Z20828 Contact with and (suspected) exposure to other viral communicable diseases: Secondary | ICD-10-CM | POA: Diagnosis not present

## 2019-05-10 DIAGNOSIS — Z23 Encounter for immunization: Secondary | ICD-10-CM | POA: Diagnosis not present

## 2019-06-01 DIAGNOSIS — Z01419 Encounter for gynecological examination (general) (routine) without abnormal findings: Secondary | ICD-10-CM | POA: Diagnosis not present

## 2019-06-01 DIAGNOSIS — N952 Postmenopausal atrophic vaginitis: Secondary | ICD-10-CM | POA: Diagnosis not present

## 2019-06-01 DIAGNOSIS — Z1231 Encounter for screening mammogram for malignant neoplasm of breast: Secondary | ICD-10-CM | POA: Diagnosis not present

## 2019-06-01 DIAGNOSIS — Z6826 Body mass index (BMI) 26.0-26.9, adult: Secondary | ICD-10-CM | POA: Diagnosis not present

## 2019-06-05 ENCOUNTER — Other Ambulatory Visit: Payer: Self-pay

## 2019-06-05 DIAGNOSIS — Z20822 Contact with and (suspected) exposure to covid-19: Secondary | ICD-10-CM

## 2019-06-06 LAB — NOVEL CORONAVIRUS, NAA: SARS-CoV-2, NAA: NOT DETECTED

## 2019-06-08 DIAGNOSIS — Z20828 Contact with and (suspected) exposure to other viral communicable diseases: Secondary | ICD-10-CM | POA: Diagnosis not present

## 2019-06-19 DIAGNOSIS — R05 Cough: Secondary | ICD-10-CM | POA: Diagnosis not present

## 2019-06-19 DIAGNOSIS — U071 COVID-19: Secondary | ICD-10-CM | POA: Diagnosis not present

## 2019-06-19 DIAGNOSIS — Z86711 Personal history of pulmonary embolism: Secondary | ICD-10-CM | POA: Diagnosis not present

## 2019-07-16 DIAGNOSIS — Z961 Presence of intraocular lens: Secondary | ICD-10-CM | POA: Diagnosis not present

## 2019-07-16 DIAGNOSIS — H43813 Vitreous degeneration, bilateral: Secondary | ICD-10-CM | POA: Diagnosis not present

## 2019-07-16 DIAGNOSIS — H26492 Other secondary cataract, left eye: Secondary | ICD-10-CM | POA: Diagnosis not present

## 2019-07-16 DIAGNOSIS — H52203 Unspecified astigmatism, bilateral: Secondary | ICD-10-CM | POA: Diagnosis not present

## 2019-08-09 DIAGNOSIS — H26492 Other secondary cataract, left eye: Secondary | ICD-10-CM | POA: Diagnosis not present

## 2020-05-01 DIAGNOSIS — M7989 Other specified soft tissue disorders: Secondary | ICD-10-CM | POA: Diagnosis not present

## 2020-05-29 DIAGNOSIS — Z01818 Encounter for other preprocedural examination: Secondary | ICD-10-CM | POA: Diagnosis not present

## 2020-06-02 ENCOUNTER — Other Ambulatory Visit: Payer: Self-pay | Admitting: Surgery

## 2020-06-02 DIAGNOSIS — D1722 Benign lipomatous neoplasm of skin and subcutaneous tissue of left arm: Secondary | ICD-10-CM | POA: Diagnosis not present

## 2020-06-10 DIAGNOSIS — Z1231 Encounter for screening mammogram for malignant neoplasm of breast: Secondary | ICD-10-CM | POA: Diagnosis not present

## 2020-06-10 DIAGNOSIS — Z6826 Body mass index (BMI) 26.0-26.9, adult: Secondary | ICD-10-CM | POA: Diagnosis not present

## 2020-06-10 DIAGNOSIS — Z01419 Encounter for gynecological examination (general) (routine) without abnormal findings: Secondary | ICD-10-CM | POA: Diagnosis not present

## 2020-12-29 ENCOUNTER — Other Ambulatory Visit: Payer: Self-pay | Admitting: Internal Medicine

## 2020-12-29 DIAGNOSIS — E7800 Pure hypercholesterolemia, unspecified: Secondary | ICD-10-CM

## 2021-01-14 ENCOUNTER — Other Ambulatory Visit: Payer: Self-pay

## 2021-01-14 ENCOUNTER — Ambulatory Visit: Payer: Self-pay | Admitting: Sports Medicine

## 2021-01-14 DIAGNOSIS — M24271 Disorder of ligament, right ankle: Secondary | ICD-10-CM

## 2021-01-14 DIAGNOSIS — M25552 Pain in left hip: Secondary | ICD-10-CM | POA: Diagnosis not present

## 2021-01-14 DIAGNOSIS — M7740 Metatarsalgia, unspecified foot: Secondary | ICD-10-CM | POA: Diagnosis not present

## 2021-01-14 NOTE — Assessment & Plan Note (Signed)
Laxity of ankle ligaments contributing to multiple ankle injuries in the past

## 2021-01-14 NOTE — Progress Notes (Signed)
    SUBJECTIVE:   CHIEF COMPLAINT / HPI:   Alicia Osborne is a 60 yr old female who presents with right ankle pain and left hip pain  Right ankle pain Pt reports right ankle pain for several years. The pain is located laterally, over the MTPs and also plantar aspect of the foot. She has had at least 5 traumatic injuries to the foot resulting in fractures in the past. She finds that she wears out her shoes a lot.   Left hip pain  Pt reports left sided, lateral hip pain for several months. This hurts most with lying on that side. No specific injury  PERTINENT  PMH / PSH: hallux rigidus  Remote bunion surgery bilaterally Fracture care for MT fractures RT foot  OBJECTIVE:   BP 119/78   Ht 5\' 4"  (1.626 m)   Wt 149 lb (67.6 kg)   BMI 25.58 kg/m    Pleasant F in NAD BP 119/78   Ht 5\' 4"  (1.626 m)   Wt 149 lb (67.6 kg)   BMI 25.58 kg/m   No flowsheet data found.   Ankle: - Inspection: No obvious deformity, erythema, swelling, or ecchymosis, ulcers, calluses, blisters - Palpation: No TTP at MT heads, no TTP at base of 5th MT, no TTP over cuboid, no tenderness over navicular prominence, no TTP over lateral or medial malleolus.  No sign of peroneal tendon subluxation or TTP. - Strength: Normal strength with dorsiflexion, plantarflexion, inversion, and eversion of foot; flexion and extension of toes - ROM: Full ROM, hypermobility at left  and right ankle with PF and inversion - Neuro/vasc: NV intact - Special Tests: Negative anterior drawer, normal inversion test.    Feet:  hallux valgus noted.  Normal arch w/I pes cavus or planus, normal arch flexibility.  Loss of transverse arches bilaterally  Note pressure callus over MT heads 2-4 bilaterally  Hips:  - Inspection: No gross deformity, no swelling, erythema, or ecchymosis - Palpation: No TTP, specifically none over greater trochanter - ROM: Normal range of motion on Flexion, extension, abduction, internal and external rotation -  Strength: weakness in hip abduction only on left - Neuro/vasc: NV intact distally  ASSESSMENT/PLAN:   Greater trochanteric pain syndrome of left lower extremity Pain over lateral left hip and hip abductor weakness. Recommended hip abductor exercises for hip abductor weakness. Follow up as needed.  Ankle ligament laxity, right Laxity of ankle ligaments contributing to multiple ankle injuries in the past  Metatarsalgia Loss of transverse arch likely contributing to the metatarsal pain. Provided pt with transverse arch support. Follow up as needed.     , MD [facility]   I observed and examined the patient with the resident and agree with assessment and plan.  Note reviewed and modified by me.  Note we suggested some ankle support for irregular surfaces on RT. Towanda Octave, MD

## 2021-01-14 NOTE — Assessment & Plan Note (Signed)
Pain over lateral left hip and hip abductor weakness. Recommended hip abductor exercises for hip abductor weakness. Follow up as needed.

## 2021-01-14 NOTE — Assessment & Plan Note (Addendum)
Loss of transverse arch likely contributing to the metatarsal pain. Provided pt with transverse arch support. Follow up 1 month with other shoes.Marland Kitchen   Sports insoles with small MT pads placed She was comfortable walking in these .Note  I reviewed visits from 2012 and we placed her in MT support at that time  Try to get these into other shoes in future if working

## 2021-01-22 ENCOUNTER — Ambulatory Visit
Admission: RE | Admit: 2021-01-22 | Discharge: 2021-01-22 | Disposition: A | Discharge status: Home / Self Care | Payer: No Typology Code available for payment source | Source: Ambulatory Visit | Attending: Internal Medicine | Admitting: Internal Medicine

## 2021-01-22 ENCOUNTER — Other Ambulatory Visit: Payer: Self-pay

## 2021-01-22 DIAGNOSIS — E7800 Pure hypercholesterolemia, unspecified: Secondary | ICD-10-CM

## 2021-02-18 ENCOUNTER — Encounter: Payer: Self-pay | Admitting: *Deleted

## 2021-02-24 ENCOUNTER — Ambulatory Visit: Payer: Self-pay

## 2021-02-24 ENCOUNTER — Ambulatory Visit: Payer: No Typology Code available for payment source | Admitting: Sports Medicine

## 2021-02-24 ENCOUNTER — Other Ambulatory Visit: Payer: Self-pay

## 2021-02-24 VITALS — BP 122/82 | Ht 64.0 in | Wt 145.0 lb

## 2021-02-24 DIAGNOSIS — M25552 Pain in left hip: Secondary | ICD-10-CM | POA: Diagnosis not present

## 2021-02-24 DIAGNOSIS — M7741 Metatarsalgia, right foot: Secondary | ICD-10-CM

## 2021-02-24 MED ORDER — NITROGLYCERIN 0.2 MG/HR TD PT24: MEDICATED_PATCH | TRANSDERMAL | 1 refills | Status: DC

## 2021-02-24 NOTE — Patient Instructions (Signed)
It was great to see you today!  Please keep up your exercises. Start using nitro patches. See protocol below. Follow up with me in 6 weeks.  Nitroglycerin Protocol  Apply 1/4 nitroglycerin patch to affected area daily. Change position of patch within the affected area every 24 hours. You may experience a headache during the first 1-2 weeks of using the patch, these should subside. If you experience headaches after beginning nitroglycerin patch treatment, you may take your preferred over the counter pain reliever. Another side effect of the nitroglycerin patch is skin irritation or rash related to patch adhesive. Please notify our office if you develop more severe headaches or rash, and stop the patch. Tendon healing with nitroglycerin patch may require 12 to 24 weeks depending on the extent of injury. Men should not use if taking Viagra, Cialis, or Levitra.  Do not use if you have migraines or rosacea.

## 2021-02-24 NOTE — Assessment & Plan Note (Signed)
We changed her metatarsal pad to the metatarsal cookies  She felt more support with this and we will try this over the next month

## 2021-02-24 NOTE — Assessment & Plan Note (Signed)
Since she continues to have pain but has already regained a lot of strength we will keep up the exercises  We will give her a trial of topical nitroglycerin to help speed healing of the tendon  Recheck this after about 6 weeks

## 2021-02-24 NOTE — Progress Notes (Signed)
F/U visit  Left metatarsalgia - still tingling but not as bad; sports insoles do help.  RT ankle clearly better with exercises and feels more stable  Left GT pain- still painful to sleep on that side   Has done the exercises and it is feeling stronger.  ROS No sciatica No pain with walking in hip  PE Pleasant female in no acute distress BP 122/82   Ht 5\' 4"  (1.626 m)   Wt 145 lb (65.8 kg)   BMI 24.89 kg/m  No flowsheet data found.  Examination of right foot reveals flattening of the transverse arch There is no significant tenderness today Metatarsal pad position slightly forward  Left hip reveals full range of motion She has regained normal hip abduction strength  Ultrasound of the left greater trochanter The gluteus medius and gluteus minimus are both visualized attaching to the superior facet of the greater trochanter There is some hypoechoic change and some mild calcification No tears are noted in the tendons or muscle belly No sign of bursal swelling  Impression: Consistent with gluteus medius tendinopathy  Ultrasound and interpretation by . Sibyl Parr, MD

## 2021-03-26 ENCOUNTER — Ambulatory Visit: Payer: No Typology Code available for payment source | Admitting: Sports Medicine

## 2021-03-31 ENCOUNTER — Ambulatory Visit: Payer: No Typology Code available for payment source | Admitting: Sports Medicine

## 2021-04-14 ENCOUNTER — Other Ambulatory Visit: Payer: Self-pay

## 2021-04-14 ENCOUNTER — Ambulatory Visit: Payer: No Typology Code available for payment source | Admitting: Cardiovascular Disease

## 2021-04-14 ENCOUNTER — Encounter: Payer: Self-pay | Admitting: Cardiovascular Disease

## 2021-04-14 VITALS — BP 110/76 | HR 79 | Ht 64.0 in | Wt 146.6 lb

## 2021-04-14 DIAGNOSIS — R931 Abnormal findings on diagnostic imaging of heart and coronary circulation: Secondary | ICD-10-CM | POA: Diagnosis not present

## 2021-04-14 DIAGNOSIS — E7800 Pure hypercholesterolemia, unspecified: Secondary | ICD-10-CM | POA: Diagnosis not present

## 2021-04-14 LAB — LIPID PANEL
Chol/HDL Ratio: 2.6 ratio (ref 0.0–4.4)
Cholesterol, Total: 136 mg/dL (ref 100–199)
HDL: 52 mg/dL
LDL Chol Calc (NIH): 72 mg/dL (ref 0–99)
Triglycerides: 56 mg/dL (ref 0–149)
VLDL Cholesterol Cal: 12 mg/dL (ref 5–40)

## 2021-04-14 NOTE — Patient Instructions (Signed)
Medication Instructions:  No changes *If you need a refill on your cardiac medications before your next appointment, please call your pharmacy*   Lab Work: Your provider would like for you to have the following labs today: lipid  If you have labs (blood work) drawn today and your tests are completely normal, you will receive your results only by: MyChart Message (if you have MyChart) OR A paper copy in the mail If you have any lab test that is abnormal or we need to change your treatment, we will call you to review the results.   Testing/Procedures: Your physician has requested that you have an exercise tolerance test. For further information please visit [email or web address] Please also follow instruction sheet, as given. This will take place at [address], [address]. Do not drink or eat foods with caffeine for 24 hours before the test. (Chocolate, coffee, tea, or energy drinks) If you use an inhaler, bring it with you to the test. Do not smoke for 4 hours before the test. Wear comfortable shoes and clothing.   Follow-Up: At [facility], you and your health needs are our priority.  As part of our continuing mission to provide you with exceptional heart care, we have created designated Provider Care Teams.  These Care Teams include your primary Cardiologist (physician) and Advanced Practice Providers (APPs -  Physician Assistants and Nurse Practitioners) who all work together to provide you with the care you need, when you need it.  We recommend signing up for the patient portal called "MyChart".  Sign up information is provided on this After Visit Summary.  MyChart is used to connect with patients for Virtual Visits (Telemedicine).  Patients are able to view lab/test results, encounter notes, upcoming appointments, etc.  Non-urgent messages can be sent to your provider as well.   To learn more about what you can do with MyChart, go to [email or web address].    Your next  appointment:   12 month(s)  The format for your next appointment:   In Person  Provider:   You may see Dr. Royann Shivers or one of the following Advanced Practice Providers on your designated Care Team:   Azalee Course, PA-C Micah Flesher, PA-C or  Judy Pimple, [place]

## 2021-04-14 NOTE — Progress Notes (Signed)
Cardiology Office Note:    Date:  04/16/2021   ID:  Alicia Osborne, DOB 1961-07-13, MRN [id number]  PCP:  Georgianne Fick, MD   [facility] Providers Cardiologist:  Thurmon Fair, MD     Referring MD: Georgianne Fick, MD   No chief complaint on file. Alicia Osborne is a 60 y.o. female who is being seen today for the evaluation of elevated coronary calcium score at the request of Georgianne Fick, MD.   History of Present Illness:    Alicia Osborne is a 60 y.o. female with a hx of remote unexplained pulmonary embolism, otherwise excellent health, referred for an elevated calcium score.  She has only borderline hypercholesterolemia, but has a strong family history of premature vascular disease.  Her mother had coronary stents and aortobifemoral bypass (she was a former smoker). Her maternal father had an abdominal aortic aneurysm and died at age 80.  She has a brother who had a brain aneurysm.  Her father died at a young age from lung cancer (61 years old).  She is physically very active and denies problems with dyspnea or chest discomfort with activity.  She denies edema, orthopnea, PND, palpitations, dizziness, syncope, focal neurological events or intermittent claudication.  Several weeks ago she was doing yard work and piling some logs when she dropped a log on her leg and hit her chest.  She had some transient discomfort with deep breathing that resolved spontaneously.  She had large bruises on both legs but these also resolved without consequences.  The recently performed coronary calcium score was elevated at 138, 92nd percentile for age and gender.  Her cholesterol is 243 and the calculated LDL is 129.  She has an excellent HDL at 58 and normal triglycerides.  She does not have hypertension, diabetes mellitus or kidney disease.  She is not a smoker.  Her ECG shows normal sinus rhythm, possible left atrial abnormality, incomplete right bundle branch block pattern, normal  repolarization with a QTC of 449 ms.  She has previously undergone hysterectomy, but did not have oophorectomy.  She has a history of a small hiatal hernia and superficial thrombophlebitis in addition to the small pulmonary embolism that occurred over 10 years ago.  Past Medical History:  Diagnosis Date   Pulmonary embolism (HCC)     Past Surgical History:  Procedure Laterality Date   ABDOMINAL HYSTERECTOMY     CESAREAN SECTION      Current Medications: Current Meds  Medication Sig   cetirizine (ZYRTEC) 10 MG tablet Take 10 mg by mouth daily.   Cholecalciferol (D3-1000 PO) Take 1 capsule by mouth daily.   multivitamin-lutein (OCUVITE-LUTEIN) CAPS capsule Take 1 capsule by mouth daily.   naproxen (NAPROSYN) 500 MG tablet Take 1 tablet (500 mg total) by mouth 2 (two) times daily.   rosuvastatin (CRESTOR) 10 MG tablet Take 10 mg by mouth daily.   triamterene-hydrochlorothiazide (MAXZIDE-25) 37.5-25 MG tablet Take 1 tablet by mouth daily.   zolpidem (AMBIEN) 5 MG tablet Take 1 tablet by mouth daily.     Allergies:   Aspirin, Hydromorphone hcl, and Ibuprofen   Social History   Socioeconomic History   Marital status: Married    Spouse name: Not on file   Number of children: Not on file   Years of education: Not on file   Highest education level: Not on file  Occupational History   Not on file  Tobacco Use   Smoking status: Never   Smokeless tobacco: Never  Substance and Sexual  Activity   Alcohol use: Yes   Drug use: No   Sexual activity: Not on file  Other Topics Concern   Not on file  Social History Narrative   Not on file   Social Determinants of Health   Financial Resource Strain: Not on file  Food Insecurity: Not on file  Transportation Needs: Not on file  Physical Activity: Not on file  Stress: Not on file  Social Connections: Not on file     Family History: The patient's family history includes CAD in her mother; Lung cancer in her father.  ROS:    Please see the history of present illness.     All other systems reviewed and are negative.  EKGs/Labs/Other Studies Reviewed:    The following studies were reviewed today: Coronary calcium score CT CT angiogram for pulmonary embolism 2011 CT and MRI of head, no evidence of intracranial aneurysm  EKG:  EKG is  ordered today.  The ekg ordered today demonstrates normal sinus rhythm, RSR prime pattern V1, questionable left atrial abnormality, no repolarization changes, QTC 449 ms   Recent Labs: No results found for requested labs within last 8760 hours.  Recent Lipid Panel    Component Value Date/Time   CHOL 136 04/14/2021 0942   TRIG 56 04/14/2021 0942   HDL 52 04/14/2021 0942   CHOLHDL 2.6 04/14/2021 0942   LDLCALC 72 04/14/2021 0942   12/18/2020 Cholesterol 243, HDL 58, LDL 129, triglycerides 138  Risk Assessment/Calculations:           Physical Exam:    VS:  BP 110/76 (BP Location: Left Arm, Patient Position: Sitting, Cuff Size: Normal)   Pulse 79   Ht 5\' 4"  (1.626 m)   Wt 146 lb 9.6 oz (66.5 kg)   SpO2 97%   BMI 25.16 kg/m     Wt Readings from Last 3 Encounters:  04/14/21 146 lb 9.6 oz (66.5 kg)  02/24/21 145 lb (65.8 kg)  01/14/21 149 lb (67.6 kg)     GEN:  Well nourished, well developed in no acute distress HEENT: Normal NECK: No JVD; No carotid bruits LYMPHATICS: No lymphadenopathy CARDIAC: RRR, no murmurs, rubs, gallops RESPIRATORY:  Clear to auscultation without rales, wheezing or rhonchi  ABDOMEN: Soft, non-tender, non-distended MUSCULOSKELETAL:  No edema; No deformity  SKIN: Warm and dry NEUROLOGIC:  Alert and oriented x 3 PSYCHIATRIC:  Normal affect   ASSESSMENT:    1. Elevated coronary artery calcium score    PLAN:    In order of problems listed above:  Elevated coronary calcium score: She has mild hypercholesterolemia but a strong family history of early onset vascular disease.  In view of the degree of elevation in the calcium score,  recommend aggressive treatment of LDL to a target less than 70, even in the absence of significant stenoses or symptoms.   At the same time, recommend a formal evaluation with an ECG treadmill stress test. HLP: She has now been on rosuvastatin for about 2 months and her LDL cholesterol is 72, very close to target and likely to decrease further.  Discussed healthy diet and exercise as well.   Shared Decision Making/Informed Consent The risks [chest pain, shortness of breath, cardiac arrhythmias, dizziness, blood pressure fluctuations, myocardial infarction, stroke/transient ischemic attack, and life-threatening complications (estimated to be 1 in 10,000)], benefits (risk stratification, diagnosing coronary artery disease, treatment guidance) and alternatives of an exercise tolerance test were discussed in detail with Alicia Osborne and she agrees to proceed.  Medication Adjustments/Labs and Tests Ordered: Current medicines are reviewed at length with the patient today.  Concerns regarding medicines are outlined above.  Orders Placed This Encounter  Procedures   Lipid panel   Cardiac Stress Test: Informed Consent Details: Physician/Practitioner Attestation; Transcribe to consent form and obtain patient signature   Exercise Tolerance Test   EKG 12-Lead   No orders of the defined types were placed in this encounter.   Patient Instructions  Medication Instructions:  No changes *If you need a refill on your cardiac medications before your next appointment, please call your pharmacy*   Lab Work: Your provider would like for you to have the following labs today: lipid  If you have labs (blood work) drawn today and your tests are completely normal, you will receive your results only by: MyChart Message (if you have MyChart) OR A paper copy in the mail If you have any lab test that is abnormal or we need to change your treatment, we will call you to review the  results.   Testing/Procedures: Your physician has requested that you have an exercise tolerance test. For further information please visit [email or web address] Please also follow instruction sheet, as given. This will take place at [address], [address]. Do not drink or eat foods with caffeine for 24 hours before the test. (Chocolate, coffee, tea, or energy drinks) If you use an inhaler, bring it with you to the test. Do not smoke for 4 hours before the test. Wear comfortable shoes and clothing.   Follow-Up: At [facility], you and your health needs are our priority.  As part of our continuing mission to provide you with exceptional heart care, we have created designated Provider Care Teams.  These Care Teams include your primary Cardiologist (physician) and Advanced Practice Providers (APPs -  Physician Assistants and Nurse Practitioners) who all work together to provide you with the care you need, when you need it.  We recommend signing up for the patient portal called "MyChart".  Sign up information is provided on this After Visit Summary.  MyChart is used to connect with patients for Virtual Visits (Telemedicine).  Patients are able to view lab/test results, encounter notes, upcoming appointments, etc.  Non-urgent messages can be sent to your provider as well.   To learn more about what you can do with MyChart, go to [email or web address].    Your next appointment:   12 month(s)  The format for your next appointment:   In Person  Provider:   You may see Dr. Royann Shivers or one of the following Advanced Practice Providers on your designated Care Team:   Azalee Course, PA-C Micah Flesher, [place] or  Judy Pimple, PA-C    Signed, Thurmon Fair, MD  04/16/2021 10:04 AM    [facility]

## 2021-04-16 ENCOUNTER — Encounter: Payer: Self-pay | Admitting: Cardiovascular Disease

## 2021-04-16 DIAGNOSIS — E7800 Pure hypercholesterolemia, unspecified: Secondary | ICD-10-CM | POA: Insufficient documentation

## 2021-04-16 DIAGNOSIS — R931 Abnormal findings on diagnostic imaging of heart and coronary circulation: Secondary | ICD-10-CM | POA: Insufficient documentation

## 2021-04-17 ENCOUNTER — Telehealth (HOSPITAL_COMMUNITY): Payer: Self-pay | Admitting: *Deleted

## 2021-04-17 NOTE — Telephone Encounter (Signed)
Close encounter 

## 2021-04-22 ENCOUNTER — Other Ambulatory Visit: Payer: Self-pay

## 2021-04-22 ENCOUNTER — Emergency Department (HOSPITAL_BASED_OUTPATIENT_CLINIC_OR_DEPARTMENT_OTHER): Payer: No Typology Code available for payment source

## 2021-04-22 ENCOUNTER — Emergency Department (HOSPITAL_BASED_OUTPATIENT_CLINIC_OR_DEPARTMENT_OTHER)
Admission: EM | Admit: 2021-04-22 | Discharge: 2021-04-22 | Disposition: A | Discharge status: Home / Self Care | Payer: No Typology Code available for payment source | Attending: Emergency Medicine | Admitting: Emergency Medicine

## 2021-04-22 ENCOUNTER — Encounter (HOSPITAL_BASED_OUTPATIENT_CLINIC_OR_DEPARTMENT_OTHER): Payer: Self-pay | Admitting: Emergency Medicine

## 2021-04-22 DIAGNOSIS — R059 Cough, unspecified: Secondary | ICD-10-CM | POA: Diagnosis not present

## 2021-04-22 DIAGNOSIS — R0789 Other chest pain: Secondary | ICD-10-CM | POA: Diagnosis not present

## 2021-04-22 DIAGNOSIS — E049 Nontoxic goiter, unspecified: Secondary | ICD-10-CM | POA: Diagnosis not present

## 2021-04-22 DIAGNOSIS — R0600 Dyspnea, unspecified: Secondary | ICD-10-CM | POA: Insufficient documentation

## 2021-04-22 LAB — CBC WITH DIFFERENTIAL/PLATELET
Abs Immature Granulocytes: 0.02 10*3/uL (ref 0.00–0.07)
Basophils Absolute: 0 10*3/uL (ref 0.0–0.1)
Basophils Relative: 1 %
Eosinophils Absolute: 0.1 10*3/uL (ref 0.0–0.5)
Eosinophils Relative: 1 %
HCT: 37.1 % (ref 36.0–46.0)
Hemoglobin: 13 g/dL (ref 12.0–15.0)
Immature Granulocytes: 0 %
Lymphocytes Relative: 46 %
Lymphs Abs: 3 10*3/uL (ref 0.7–4.0)
MCH: 31.8 pg (ref 26.0–34.0)
MCHC: 35 g/dL (ref 30.0–36.0)
MCV: 90.7 fL (ref 80.0–100.0)
Monocytes Absolute: 0.6 10*3/uL (ref 0.1–1.0)
Monocytes Relative: 9 %
Neutro Abs: 2.8 10*3/uL (ref 1.7–7.7)
Neutrophils Relative %: 43 %
Platelets: 248 10*3/uL (ref 150–400)
RBC: 4.09 MIL/uL (ref 3.87–5.11)
RDW: 12.4 % (ref 11.5–15.5)
WBC: 6.5 10*3/uL (ref 4.0–10.5)
nRBC: 0 % (ref 0.0–0.2)

## 2021-04-22 LAB — BASIC METABOLIC PANEL
Anion gap: 9 (ref 5–15)
BUN: 9 mg/dL (ref 8–23)
CO2: 29 mmol/L (ref 22–32)
Calcium: 9.6 mg/dL (ref 8.9–10.3)
Chloride: 95 mmol/L — ABNORMAL LOW (ref 98–111)
Creatinine, Ser: 0.62 mg/dL (ref 0.44–1.00)
GFR, Estimated: >60 mL/min (ref >60)
Glucose, Bld: 90 mg/dL (ref 70–99)
Potassium: 3.4 mmol/L — ABNORMAL LOW (ref 3.5–5.1)
Sodium: 133 mmol/L — ABNORMAL LOW (ref 135–145)

## 2021-04-22 MED ORDER — IOHEXOL 350 MG/ML SOLN
75.0000 mL | Freq: Once | INTRAVENOUS | Status: AC | PRN
  Administered 2021-04-22: 65 mL via INTRAVENOUS

## 2021-04-22 NOTE — Discharge Instructions (Addendum)
We saw you in the ER for concerns for blood clots in the lungs.  CT scan is negative for blood clots, pneumonia, traumatic lung injury.  There is an incidental finding of enlarged thyroid, which your PCP will have to follow-up on.

## 2021-04-22 NOTE — ED Notes (Signed)
No computer for patient to sign D/C instructions available.  Patient gave verbal understanding of instruction

## 2021-04-22 NOTE — ED Triage Notes (Signed)
Reports cracking a few ribs a month ago.  Reports having a cough for a while that got worse after cracking the ribs.  Noticed a feeling like a "mask" on her face today.  Reports hx of the same.  Last time she was positive for a PE.  Does endorses some SOB since cracking the rib.

## 2021-04-22 NOTE — ED Provider Notes (Signed)
[facility] EMERGENCY DEPT Provider Note   CSN: [id number] Arrival date & time: 04/22/21  1748     History Chief Complaint  Patient presents with   Evaluation    Bennie Scaff is a 60 y.o. female.  HPI     60 year old female comes in with chief complaint of chest discomfort, and abnormal sensation around her face.  Patient reports that few days back she had injury to her ribs.  Since that she has been having some chest discomfort, which did improve, but has not resolved.  She has subsequently developed a dry cough and is having some cracking like sensation over her ribs with deep inspiration and cough.  She has history of PE, she does not have any pleuritic chest pain, hemoptysis but today she had tingling sensation around her face, and the only time she had similar symptoms was when she had a PE 10 years ago.  She talked to her husband who is a physician, and the plan was made for her to come to rule out pulmonary embolism.  Patient's last PE was 10 years ago.  She is not on any anticoagulation.  She denies any other focal numbness, weakness, tingling, exertional component to chest pain, shortness of breath.  There is no family history of MS or any other neuromuscular condition.  Past Medical History:  Diagnosis Date   Pulmonary embolism [facility])     Patient Active Problem List   Diagnosis Date Noted   Elevated coronary artery calcium score 04/16/2021   Hypercholesterolemia 04/16/2021   Ankle ligament laxity, right 01/14/2021   Metatarsalgia 01/14/2021   Greater trochanteric pain syndrome of left lower extremity 01/14/2021   HALLUX RIGIDUS 07/12/2008    Past Surgical History:  Procedure Laterality Date   ABDOMINAL HYSTERECTOMY     CESAREAN SECTION       OB History   No obstetric history on file.     Family History  Problem Relation Age of Onset   CAD Mother    Lung cancer Father     Social History   Tobacco Use   Smoking status: Never   Smokeless  tobacco: Never  Substance Use Topics   Alcohol use: Yes   Drug use: No    Home Medications Prior to Admission medications   Medication Sig Start Date End Date Taking? Authorizing Provider  cetirizine (ZYRTEC) 10 MG tablet Take 10 mg by mouth daily.    [provider]  Cholecalciferol (D3-1000 PO) Take 1 capsule by mouth daily.    [provider]  multivitamin-lutein (OCUVITE-LUTEIN) CAPS capsule Take 1 capsule by mouth daily.    [provider]  naproxen (NAPROSYN) 500 MG tablet Take 1 tablet (500 mg total) by mouth 2 (two) times daily. 02/04/18   Alene Mires, NP  rosuvastatin (CRESTOR) 10 MG tablet Take 10 mg by mouth daily. 02/02/21   [provider]  triamterene-hydrochlorothiazide (MAXZIDE-25) 37.5-25 MG tablet Take 1 tablet by mouth daily. 03/03/21   [provider]  zolpidem (AMBIEN) 5 MG tablet Take 1 tablet by mouth daily.    [provider]    Allergies    Aspirin, Hydromorphone hcl, and Ibuprofen  Review of Systems   Review of Systems  Constitutional:  Positive for activity change.  Respiratory:  Positive for chest tightness. Negative for shortness of breath.   Cardiovascular:  Positive for chest pain.  Neurological:  Negative for dizziness and syncope.  Hematological:  Does not bruise/bleed easily.   Physical Exam Updated  Vital Signs BP (!) 143/98 (BP Location: Right Arm)   Pulse 89   Temp 98.3 F (36.8 C) (Oral)   Resp 18   Ht 5\' 4"  (1.626 m)   Wt 65.8 kg   SpO2 100%   BMI 24.89 kg/m   Physical Exam Vitals and nursing note reviewed.  Constitutional:      Appearance: She is well-developed.  HENT:     Head: Atraumatic.  Cardiovascular:     Rate and Rhythm: Normal rate.  Pulmonary:     Effort: Pulmonary effort is normal.  Musculoskeletal:     Cervical back: Normal range of motion and neck supple.     Right lower leg: No edema.     Left lower leg: No edema.  Skin:    General: Skin is warm and  dry.  Neurological:     Mental Status: She is alert and oriented to person, place, and time.    ED Results / Procedures / Treatments   Labs (all labs ordered are listed, but only abnormal results are displayed) Labs Reviewed  BASIC METABOLIC PANEL - Abnormal; Notable for the following components:      Result Value   Sodium 133 (*)    Potassium 3.4 (*)    Chloride 95 (*)    All other components within normal limits  CBC WITH DIFFERENTIAL/PLATELET    EKG EKG Interpretation  Date/Time:  Wednesday April 22 2021 18:14:56 EDT Ventricular Rate:  81 PR Interval:  152 QRS Duration: 98 QT Interval:  380 QTC Calculation: 441 R Axis:   66 Text Interpretation: Normal sinus rhythm Right atrial enlargement Incomplete right bundle branch block Borderline ECG No acute changes Confirmed by 08-17-1983 Derwood Kaplan) on 04/22/2021 8:34:37 PM  Radiology CT Angio Chest PE W and/or Wo Contrast  Result Date: 04/22/2021 CLINICAL DATA:  PE suspected, high prob. Progressive, productive cough. EXAM: CT ANGIOGRAPHY CHEST WITH CONTRAST TECHNIQUE: Multidetector CT imaging of the chest was performed using the standard protocol during bolus administration of intravenous contrast. Multiplanar CT image reconstructions and MIPs were obtained to evaluate the vascular anatomy. CONTRAST:  91mL OMNIPAQUE IOHEXOL 350 MG/ML SOLN COMPARISON:  None. FINDINGS: Cardiovascular: There is adequate opacification of the pulmonary arterial tree. No intraluminal filling defect identified to suggest acute pulmonary embolism. The central pulmonary arteries are of normal caliber. Mild coronary artery calcification. Global cardiac size within normal limits. No pericardial effusion. Thoracic aorta is unremarkable. Mediastinum/Nodes: And isoenhancing nodule is seen arising exophytically inferiorly from the thyroid isthmus measuring 12 mm in greatest dimension demonstrating psammomatous calcification. This is not well characterized on this  examination. No pathologic thoracic adenopathy. The esophagus is unremarkable. Small hiatal hernia. Lungs/Pleura: Lungs are clear. No pleural effusion or pneumothorax. Upper Abdomen: No acute abnormality. Musculoskeletal: There are healed fractures of the a second rib bilaterally and the right third rib anteriorly. No acute bone abnormality Review of the MIP images confirms the above findings. IMPRESSION: No pulmonary embolism.  No acute intrathoracic pathology identified. Mild coronary artery calcification. 12 mm isoenhancing exophytic thyroid nodule demonstrating irregular calcification. While this would not typically warrant further evaluation based on size criteria alone, irregular calcification may warrant dedicated thyroid sonography for definitive evaluation if clinically indicated. Small hiatal hernia Healed bilateral rib fractures as outlined above. Electronically Signed   By: 76m M.D.   On: 04/22/2021 19:52    Procedures Procedures   Medications Ordered in ED Medications  iohexol (OMNIPAQUE) 350 MG/ML injection 75 mL (65 mLs Intravenous Contrast Given  04/22/21 1915)    ED Course  I have reviewed the triage vital signs and the nursing notes.  Pertinent labs & imaging results that were available during my care of the patient were reviewed by me and considered in my medical decision making (see chart for details).    MDM Rules/Calculators/A&P                           60 year old comes in with chief complaint of abnormal sensation around her face, symptoms she had when she had a PE 10 years back.  She is also having some vague chest discomfort, dry cough and some dyspnea.  Patient falls in intermittent risk criteria for PE clinically.  I had shared decision making with her to see if it is best to proceed with dimer versus CT angiogram.  It was evident that patient does want to make sure definitively that is not PE.  She is supposed to get a stress test even tomorrow.  CT PE  was ordered, it is negative for acute PE.  However there is an incidental finding of thyroid abnormality.  I have discussed the abnormal thyroid finding with the patient and advised PCP follow-up for it.  We do not have clinical suspicion for ACS, no troponins ordered.  EKG is reassuring with some right axis deviation and incomplete right bundle.  Final Clinical Impression(s) / ED Diagnoses Final diagnoses:  Enlarged thyroid  Chest wall pain    Rx / DC Orders ED Discharge Orders     None        Derwood Kaplan, MD 04/22/21 2036

## 2021-04-23 ENCOUNTER — Ambulatory Visit (HOSPITAL_COMMUNITY)
Admission: RE | Admit: 2021-04-23 | Discharge: 2021-04-23 | Disposition: A | Discharge status: Home / Self Care | Payer: No Typology Code available for payment source | Source: Ambulatory Visit | Attending: Cardiology | Admitting: Cardiology

## 2021-04-23 DIAGNOSIS — R931 Abnormal findings on diagnostic imaging of heart and coronary circulation: Secondary | ICD-10-CM | POA: Insufficient documentation

## 2021-04-23 LAB — EXERCISE TOLERANCE TEST
Estimated workload: 13.3
Exercise duration (min): 11 min
Exercise duration (sec): 0 s
MPHR: 159 {beats}/min
Peak HR: 166 {beats}/min
Percent HR: 104 %
Rest HR: 74 {beats}/min
ST Depression (mm): 0 mm

## 2021-05-26 ENCOUNTER — Ambulatory Visit: Payer: No Typology Code available for payment source | Admitting: Sports Medicine

## 2021-05-26 ENCOUNTER — Other Ambulatory Visit: Payer: Self-pay | Admitting: Endocrinology

## 2021-05-26 DIAGNOSIS — E041 Nontoxic single thyroid nodule: Secondary | ICD-10-CM

## 2021-05-28 ENCOUNTER — Ambulatory Visit (INDEPENDENT_AMBULATORY_CARE_PROVIDER_SITE_OTHER): Payer: No Typology Code available for payment source | Admitting: Sports Medicine

## 2021-05-28 VITALS — BP 122/86 | Ht 64.0 in | Wt 140.0 lb

## 2021-05-28 DIAGNOSIS — M25552 Pain in left hip: Secondary | ICD-10-CM

## 2021-05-28 NOTE — Progress Notes (Signed)
   Alicia Osborne is a 60 y.o. female who presents to [facility] today for the following:    Left greater trochanteric pain Ultrasound on 8/2 showed some hypoechoic change and mild calcification within the gluteus medius She has not been able to tolerate nitro patches Has been doing HEP Doesn't feel like she is improving Only has pain with sleeping Wants to know what else she can do   Follow-up left metatarsalgia Last seen for the same on 8/22 Has been improving with insoles and metatarsal cookies   PMH reviewed.  ROS as above. Medications reviewed.  Exam:  BP 122/86   Ht 5\' 4"  (1.626 m)   Wt 140 lb (63.5 kg)   BMI 24.03 kg/m  Gen: Well NAD MSK:   Left Hip:  - Inspection: No gross deformity, no swelling, erythema, or ecchymosis b/l - Palpation: Mild TTP over GT at glute med insertion - ROM: Normal range of motion on Flexion, extension, abduction, internal and external rotation b/l - Strength: Normal strength in all fields b/l, with some pain with resisted hip abduction on the left - Neuro/vasc: NV intact distally b/l - Special Tests: Negative FABER and FADIR b/l.    No results found.   Assessment and Plan: 1) Greater trochanteric pain syndrome of left lower extremity Did not tolerate nitro.  Not improving with HEP alone.  Discussed risks and benefits of trial of shockwave therapy, which she opted to proceed with.  Following the procedure below, she opted to continue treatment.  We will have her perform weekly treatments for at least 4-6 treatments and then we will determine from there if we will continue.  Did discuss the importance of continuing rehabilitation exercises in the meantime.  Procedure: ECSWT Indications:   Left greater trochanteric pain syndrome with gluteus medius calcific tendinopathy   Procedure Details Consent: Risks of procedure as well as the alternatives and risks of each were explained to the patient.  Written consent for procedure obtained. Time Out:  Verified patient identification, verified procedure, site was marked, verified correct patient position, medications/allergies/relevent history reviewed.  The area was cleaned with alcohol swab.     The  left gluteus medius was targeted for Extracorporeal shockwave therapy.    Preset:  Trochanteric bursitis Power Level:  90 Frequency: 10 Impulse/cycles: 2000 Head size:  Large   Patient tolerated procedure well without immediate complications.  She did have some skin redness following the procedure.     , D.O.  PGY-4 [place]  05/28/2021 5:22 PM  I observed and examined the patient with the [facility] resident and agree with assessment and plan.  Note reviewed and modified by me. [facility], MD

## 2021-05-28 NOTE — Assessment & Plan Note (Signed)
Did not tolerate nitro.  Not improving with HEP alone.  Discussed risks and benefits of trial of shockwave therapy, which she opted to proceed with.  Following the procedure below, she opted to continue treatment.  We will have her perform weekly treatments for at least 4-6 treatments and then we will determine from there if we will continue.  Did discuss the importance of continuing rehabilitation exercises in the meantime.  Procedure: ECSWT Indications:   Left greater trochanteric pain syndrome with gluteus medius calcific tendinopathy   Procedure Details Consent: Risks of procedure as well as the alternatives and risks of each were explained to the patient.  Written consent for procedure obtained. Time Out: Verified patient identification, verified procedure, site was marked, verified correct patient position, medications/allergies/relevent history reviewed.  The area was cleaned with alcohol swab.     The  left gluteus medius was targeted for Extracorporeal shockwave therapy.    Preset:  Trochanteric bursitis Power Level:  90 Frequency: 10 Impulse/cycles: 2000 Head size:  Large   Patient tolerated procedure well without immediate complications.  She did have some skin redness following the procedure.

## 2021-06-03 ENCOUNTER — Ambulatory Visit (INDEPENDENT_AMBULATORY_CARE_PROVIDER_SITE_OTHER): Payer: Self-pay | Admitting: Family Medicine

## 2021-06-03 VITALS — Ht 64.0 in

## 2021-06-03 DIAGNOSIS — M25552 Pain in left hip: Secondary | ICD-10-CM

## 2021-06-03 NOTE — Progress Notes (Signed)
   Alicia Osborne is a 60 y.o. female who presents to [facility] today for the following:  Left greater trochanteric pain syndrome Trialed shockwave therapy on 11/3 and was able to tolerate Did well with this treatment and thinks there was a small improvement She presents today for her second treatment   Procedure: ECSWT Indications: Left greater trochanteric pain syndrome   Procedure Details Consent: Risks of procedure as well as the alternatives and risks of each were explained to the patient.  Written consent for procedure obtained. Time Out: Verified patient identification, verified procedure, site was marked, verified correct patient position, medications/allergies/relevent history reviewed.  The area was cleaned with alcohol swab.     The left greater trochanter and gluteus medius was targeted for Extracorporeal shockwave therapy.    Preset: Trochanteric bursitis Power Level: 100 Frequency: 10 Impulse/cycles: 2000 Head size: Large   Patient tolerated procedure well without immediate complications.  There was a small area of overlying ecchymosis at the greater trochanter and she did have some skin redness following the procedure.    Luis Abed, D.O.  PGY-4 [place] [facility]  06/03/2021 5:01 PM  Addendum:  I was the preceptor for this visit and available for immediate consultation.  Norton Blizzard MD Marrianne Mood

## 2021-06-10 ENCOUNTER — Ambulatory Visit
Admission: RE | Admit: 2021-06-10 | Discharge: 2021-06-10 | Disposition: A | Discharge status: Home / Self Care | Payer: No Typology Code available for payment source | Source: Ambulatory Visit | Attending: Endocrinology | Admitting: Endocrinology

## 2021-06-10 DIAGNOSIS — E041 Nontoxic single thyroid nodule: Secondary | ICD-10-CM

## 2021-06-11 ENCOUNTER — Ambulatory Visit: Payer: No Typology Code available for payment source | Admitting: Sports Medicine

## 2021-06-12 ENCOUNTER — Other Ambulatory Visit: Payer: Self-pay | Admitting: Internal Medicine

## 2021-06-12 DIAGNOSIS — E041 Nontoxic single thyroid nodule: Secondary | ICD-10-CM

## 2021-06-17 LAB — COLOGUARD: COLOGUARD: NEGATIVE

## 2021-06-23 ENCOUNTER — Other Ambulatory Visit (HOSPITAL_COMMUNITY)
Admission: RE | Admit: 2021-06-23 | Discharge: 2021-06-23 | Disposition: A | Discharge status: Home / Self Care | Payer: No Typology Code available for payment source | Source: Ambulatory Visit | Attending: Interventional Radiology | Admitting: Interventional Radiology

## 2021-06-23 ENCOUNTER — Ambulatory Visit
Admission: RE | Admit: 2021-06-23 | Discharge: 2021-06-23 | Disposition: A | Discharge status: Home / Self Care | Payer: No Typology Code available for payment source | Source: Ambulatory Visit | Attending: Internal Medicine | Admitting: Internal Medicine

## 2021-06-23 DIAGNOSIS — E041 Nontoxic single thyroid nodule: Secondary | ICD-10-CM | POA: Diagnosis present

## 2021-06-24 LAB — CYTOLOGY - NON PAP

## 2021-07-09 ENCOUNTER — Encounter: Payer: Self-pay | Admitting: Student

## 2021-07-09 ENCOUNTER — Other Ambulatory Visit: Payer: Self-pay

## 2021-07-09 ENCOUNTER — Ambulatory Visit: Payer: No Typology Code available for payment source | Admitting: Student

## 2021-07-09 VITALS — BP 124/74 | HR 74 | Temp 98.0°F | Ht 64.0 in | Wt 138.2 lb

## 2021-07-09 DIAGNOSIS — R053 Chronic cough: Secondary | ICD-10-CM

## 2021-07-09 NOTE — Patient Instructions (Signed)
-   Could be worthwhile to at least test for cough-variant asthma - would check pre and post bronchodilator spirometry in our office if you like - Alternatively if more bothered by cough, then would probably recommend therapeutic trial of proton-pump inhibitor 30 minutes before dinner

## 2021-07-09 NOTE — Progress Notes (Signed)
Synopsis: Referred for chronic cough by Georgianne Fick, MD  Subjective:   PATIENT ID: Alicia Osborne GENDER: female DOB: 05-01-1961, MRN: [id number]  Chief Complaint  Patient presents with   Consult    Pt states chronic cough with no other symptoms    60yF with history PE - she thinks unprovoked 10 ya, one in leg as well - was on a blood thinner for years. Last year had covid-19 - was not hospitalized. Was vaccinated for it.   She has had a cough for decades, dry. Has never had an ACEi. No dyspnea, chest tightness with cough. No post-nasal drainage. No sinonasal congestion. No overt reflux. Has had inhaler once when she had the flu. Has never had prednisone that she's aware of.   Otherwise pertinent review of systems is negative.  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 She has no pets. No pet bird. No exotic travel.  Past Medical History:  Diagnosis Date   Pulmonary embolism (HCC)      Family History  Problem Relation Age of Onset   CAD Mother    Lung cancer Father      Past Surgical History:  Procedure Laterality Date   ABDOMINAL HYSTERECTOMY     CESAREAN SECTION      Social History   Socioeconomic History   Marital status: Married    Spouse name: Not on file   Number of children: Not on file   Years of education: Not on file   Highest education level: Not on file  Occupational History   Not on file  Tobacco Use   Smoking status: Never   Smokeless tobacco: Never  Substance and Sexual Activity   Alcohol use: Yes   Drug use: No   Sexual activity: Not on file  Other Topics Concern   Not on file  Social History Narrative   Not on file   Social Determinants of Health   Financial Resource Strain: Not on file  Food Insecurity: Not on file  Transportation Needs: Not on file  Physical Activity: Not on file  Stress: Not on file  Social Connections: Not on file  Intimate Partner Violence: Not on  file     Allergies  Allergen Reactions   Aspirin Other (See Comments)    Patient is on coumadin   Hydromorphone Hcl Itching   Ibuprofen     REACTION: hives     Outpatient Medications Prior to Visit  Medication Sig Dispense Refill   Cholecalciferol (D3-1000 PO) Take 1 capsule by mouth daily.     multivitamin-lutein (OCUVITE-LUTEIN) CAPS capsule Take 1 capsule by mouth daily.     rosuvastatin (CRESTOR) 10 MG tablet Take 10 mg by mouth daily.     triamterene-hydrochlorothiazide (MAXZIDE-25) 37.5-25 MG tablet Take 1 tablet by mouth daily.     cetirizine (ZYRTEC) 10 MG tablet Take 10 mg by mouth daily.     naproxen (NAPROSYN) 500 MG tablet Take 1 tablet (500 mg total) by mouth 2 (two) times daily. 30 tablet 0   zolpidem (AMBIEN) 5 MG tablet Take 1 tablet by mouth daily.     No facility-administered medications prior to visit.       Objective:   Physical Exam:  General appearance: 60 y.o., female, NAD, conversant  Eyes: anicteric sclerae; PERRL, tracking appropriately HENT: NCAT; MMM Neck: Trachea midline; no lymphadenopathy, no JVD Lungs: CTAB, no crackles, no wheeze, with normal respiratory effort CV: RRR, no  murmur  Abdomen: Soft, non-tender; non-distended, BS present  Extremities: No peripheral edema, warm Skin: Normal turgor and texture; no rash Psych: Appropriate affect Neuro: Alert and oriented to person and place, no focal deficit     Vitals:   07/09/21 1529  BP: 124/74  Pulse: 74  Temp: 98 F (36.7 C)  TempSrc: Oral  SpO2: 100%  Weight: 138 lb 3.2 oz (62.7 kg)  Height: 5\' 4"  (1.626 m)   100% on RA BMI Readings from Last 3 Encounters:  07/09/21 23.72 kg/m  06/03/21 24.03 kg/m  05/28/21 24.03 kg/m   Wt Readings from Last 3 Encounters:  07/09/21 138 lb 3.2 oz (62.7 kg)  05/28/21 140 lb (63.5 kg)  04/22/21 145 lb (65.8 kg)     CBC    Component Value Date/Time   WBC 6.5 04/22/2021 1834   RBC 4.09 04/22/2021 1834   HGB 13.0 04/22/2021 1834    HCT 37.1 04/22/2021 1834   PLT 248 04/22/2021 1834   MCV 90.7 04/22/2021 1834   MCH 31.8 04/22/2021 1834   MCHC 35.0 04/22/2021 1834   RDW 12.4 04/22/2021 1834   LYMPHSABS 3.0 04/22/2021 1834   MONOABS 0.6 04/22/2021 1834   EOSABS 0.1 04/22/2021 1834   BASOSABS 0.0 04/22/2021 1834    Chest Imaging: CTA Chest 04/22/21 reviewed by me borderline bronchial wall thickening, small hiatal hernia, healed rib fx  Pulmonary Functions Testing Results: No flowsheet data found.      Assessment & Plan:   # Chronic cough Unclear etiology. Of common causes the first thing I'd try to tackle would be GERD with therapeutic trial of ppi for 8 weeks while we check PFTs.  # History of DVT, PE: Would need more info on nature of each event and timing to know for sure if unprovoked and occurring not in same instance of VTE.   Plan: - she'll let 04/24/21 know if cough eventually bothersome enough to try ppi 30 minutes before dinner for 8 weeks and to want to check PFTs  I spent 32 minutes dedicated to the care of this patient on the date of this encounter to include pre-visit review of records, face-to-face time with the patient discussing conditions above, clinical documentation with the electronic health record, and communicating necessary findings to members of the patients care team.     [place], MD [facility] 07/09/2021 5:00 PM

## 2021-07-15 ENCOUNTER — Ambulatory Visit: Payer: Self-pay | Admitting: Family Medicine

## 2021-12-02 ENCOUNTER — Ambulatory Visit (INDEPENDENT_AMBULATORY_CARE_PROVIDER_SITE_OTHER): Payer: Self-pay | Admitting: Family Medicine

## 2021-12-02 VITALS — Ht 64.0 in

## 2021-12-02 DIAGNOSIS — M25552 Pain in left hip: Secondary | ICD-10-CM

## 2021-12-02 NOTE — Progress Notes (Signed)
? ?  Alicia Osborne is a 61 y.o. female who presents to [facility] today for the following: ? ?Left greater trochanteric pain syndrome ?She last had this performed in November ?Had 2 treatments total ?Reports that she had significant improvement in he rpain, but had a lot of bruising after the second treatment that made her sore ?Not on a blood thinner ?Still 50% better than she was before the treatment ? ?Procedure: ECSWT ?Indications:  left greater trochanteric pain syndrome ?  ?Procedure Details ?Consent: Risks of procedure as well as the alternatives and risks of each were explained to the patient.  Written consent for procedure obtained. ?Time Out: Verified patient identification, verified procedure, site was marked, verified correct patient position, medications/allergies/relevent history reviewed.  The area was cleaned with alcohol swab.   ?  ?The left greater trochanter/gluteus medius was targeted for Extracorporeal shockwave therapy.  ?  ?Preset: trochanteric bursitis ?Power Level: 90 ?Frequency: 10 ?Impulse/cycles: 2000 ?Head size: large ?  ?Patient tolerated procedure well without immediate complications ? Given her bruising previously, decreased the power and will space out treatments by 2 weeks to see how she does with this ?If severe bruising after this treatment, can space out further ? ? ?Luis Abed, D.O.  ?PGY-4 [facility]  ?12/02/2021 4:38 PM ? ?Addendum:  I was the preceptor for this visit and available for immediate consultation.  Norton Blizzard MD CAQSM ? ?

## 2021-12-09 DIAGNOSIS — M8589 Other specified disorders of bone density and structure, multiple sites: Secondary | ICD-10-CM | POA: Diagnosis not present

## 2021-12-16 ENCOUNTER — Ambulatory Visit: Payer: Self-pay | Admitting: Family Medicine

## 2021-12-16 NOTE — Progress Notes (Unsigned)
   Alicia Osborne is a 61 y.o. female who presents to [facility] today for the following:  Left greater trochanteric pain syndrome Patient presents today for his second shockwave therapy treatment after restarting from previous shockwave treatments that she had in November Last treatment was on 5/10, as she had had some bruising after back-to-back treatments in November ***   Procedure: ECSWT Indications:  Left greater trochanteric pain syndrome   Procedure Details Consent: Risks of procedure as well as the alternatives and risks of each were explained to the patient.  Written consent for procedure obtained. Time Out: Verified patient identification, verified procedure, site was marked, verified correct patient position, medications/allergies/relevent history reviewed.  The area was cleaned with alcohol swab.     The left greater trochanter/gluteus medius was targeted for Extracorporeal shockwave therapy.    Preset: Trochanteric bursitis Power Level: 90 Frequency: 10 Impulse/cycles: 2000*** Head size: large   Patient tolerated procedure well without immediate complications    Luis Abed, D.O.  PGY-4 [facility]  12/16/2021 8:26 AM

## 2021-12-17 DIAGNOSIS — R5383 Other fatigue: Secondary | ICD-10-CM | POA: Diagnosis not present

## 2021-12-17 DIAGNOSIS — Z0000 Encounter for general adult medical examination without abnormal findings: Secondary | ICD-10-CM | POA: Diagnosis not present

## 2022-01-11 DIAGNOSIS — F4323 Adjustment disorder with mixed anxiety and depressed mood: Secondary | ICD-10-CM | POA: Diagnosis not present

## 2022-06-24 IMAGING — US US FNA BIOPSY THYROID 1ST LESION
1 series · 13 of 13 positions shown · non-contrast
Comparison: US Thyroid, 06/10/2021 and 11/20/2009. CTA chest,
04/22/2021.

MEDICATIONS:
None

COMPLICATIONS:
None immediate.

INDICATION: Indeterminate isthmus thyroid nodule

EXAM:
ULTRASOUND GUIDED FINE NEEDLE ASPIRATION OF INDETERMINATE ISTHMUS
THYROID NODULE
TECHNIQUE: Informed written consent was obtained from the patient after a
discussion of the risks, benefits and alternatives to treatment.
Questions regarding the procedure were encouraged and answered. A
timeout was performed prior to the initiation of the procedure.

[Series 1: us fna biopsy thyroid 1st lesion · 0.04mm/px · 13 acquisitions, 13 frames shown]
[im 1/13]
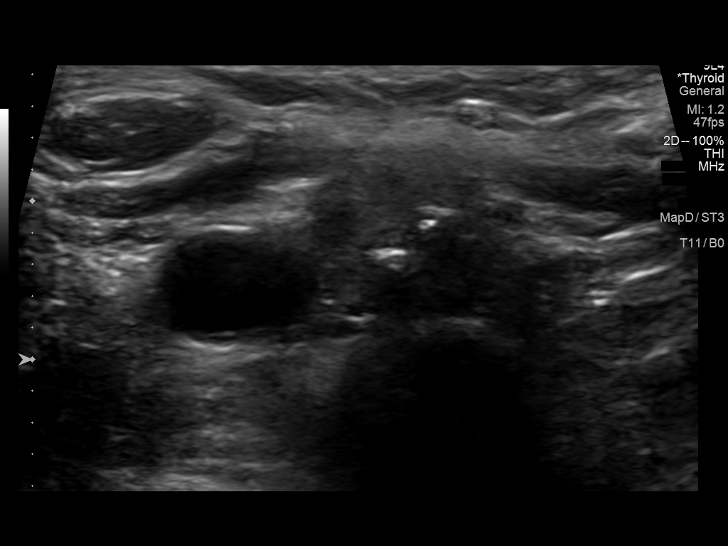
[im 2/13]
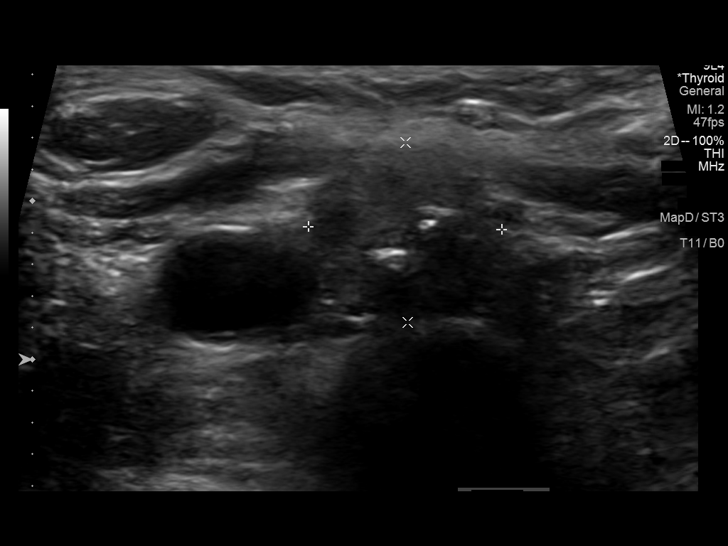
[im 3/13]
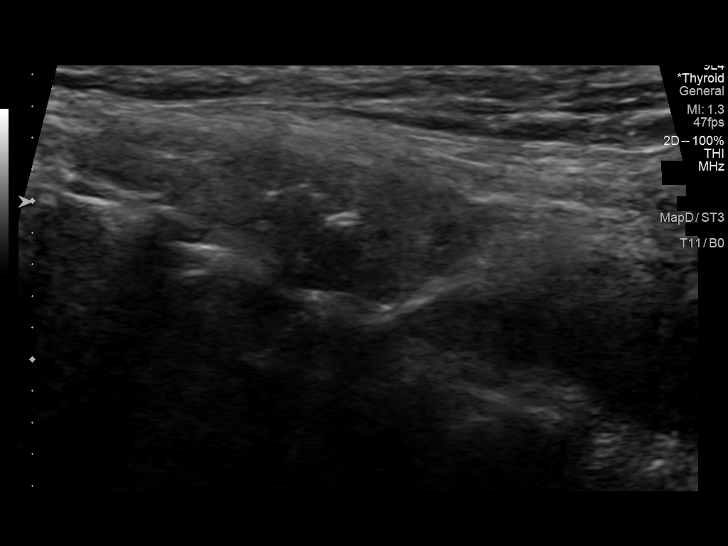
[im 4/13]
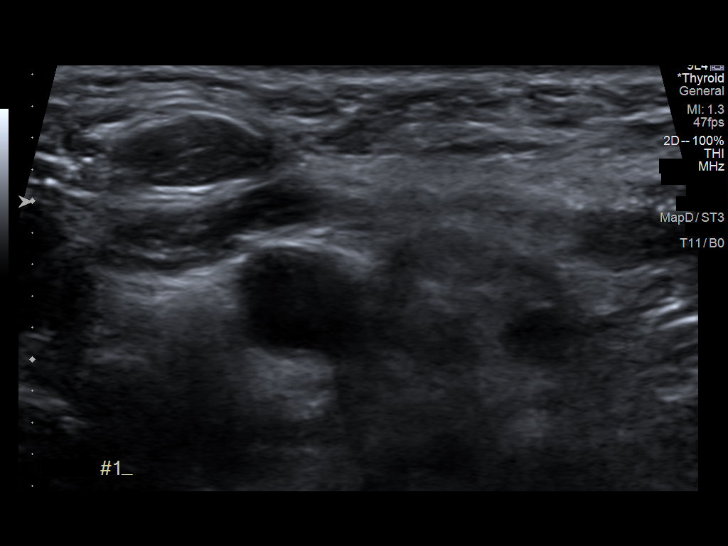
[im 5/13]
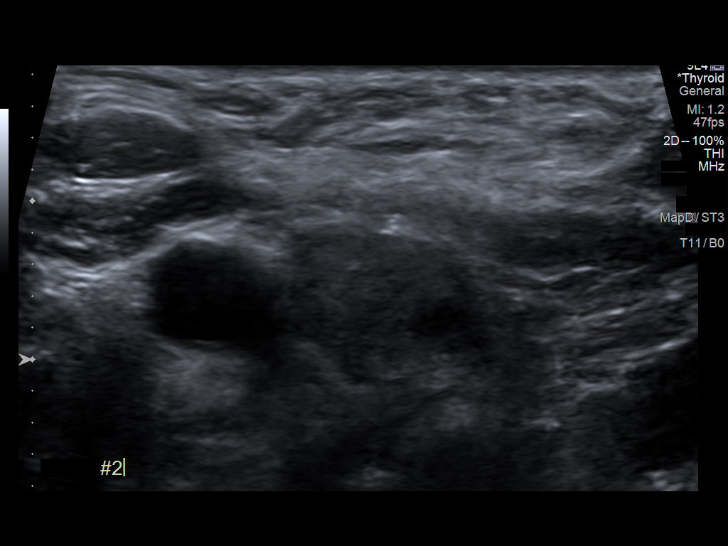
[im 6/13]
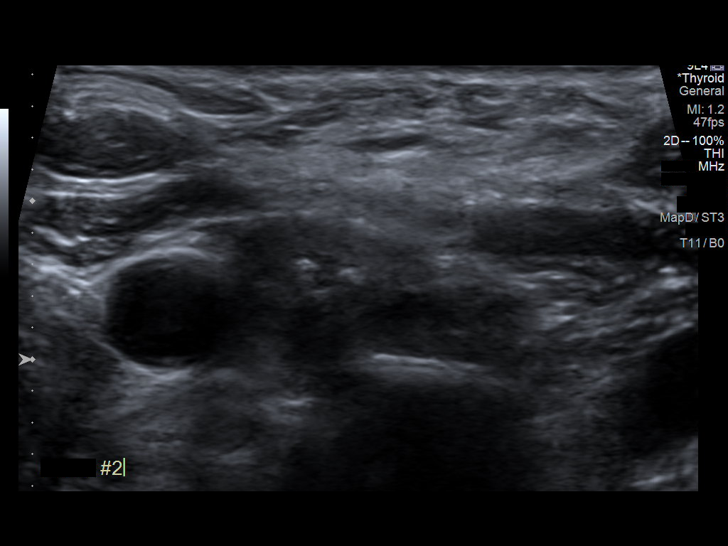
[im 7/13]
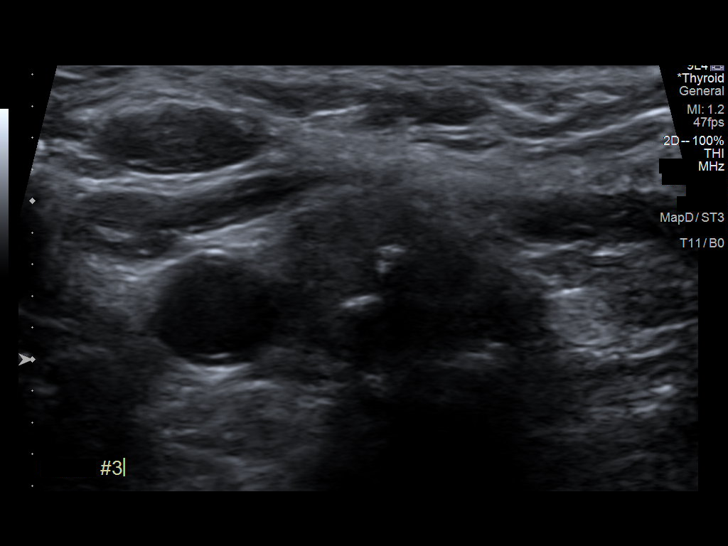
[im 8/13]
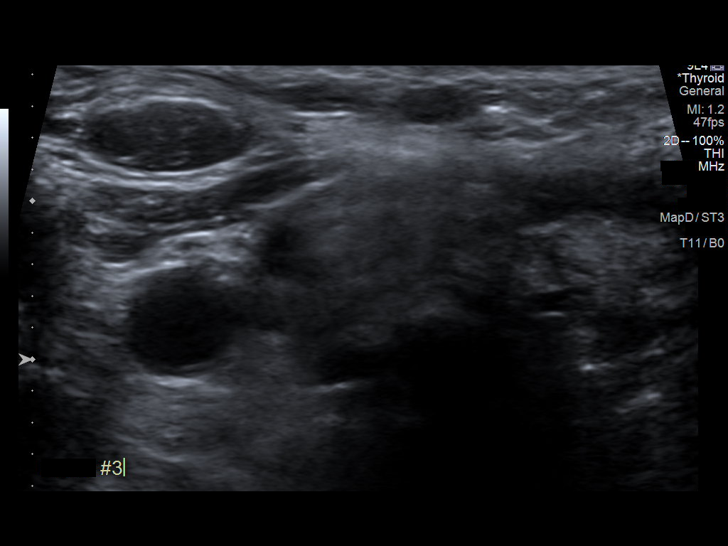
[im 9/13]
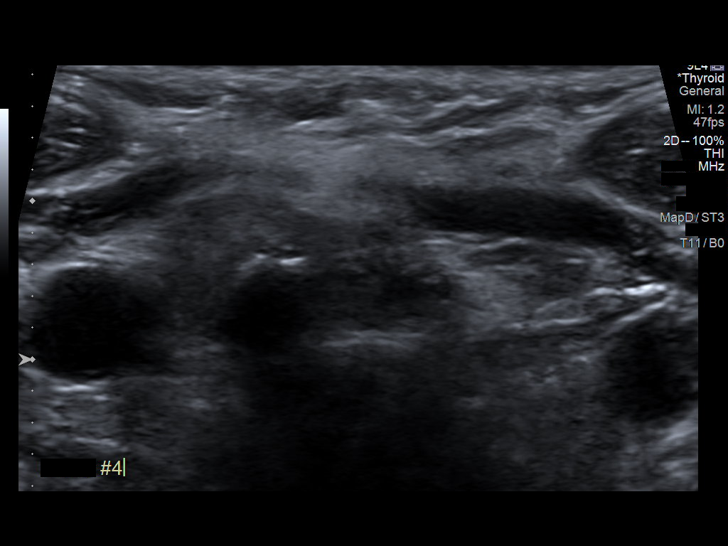
[im 10/13]
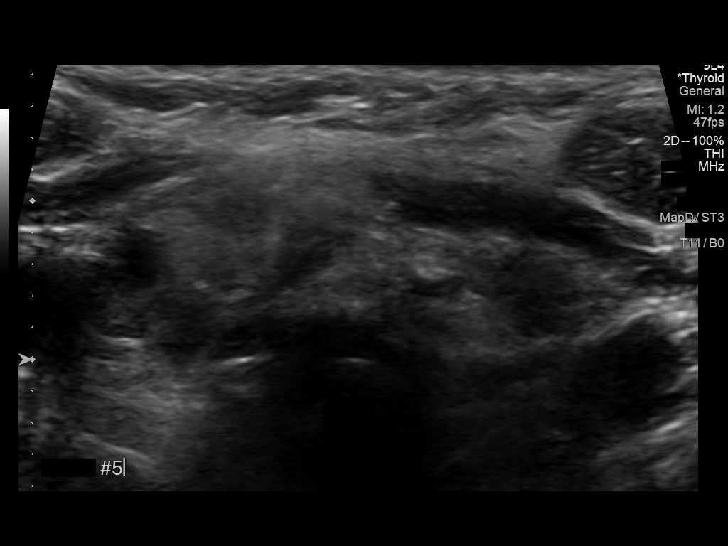
[im 11/13]
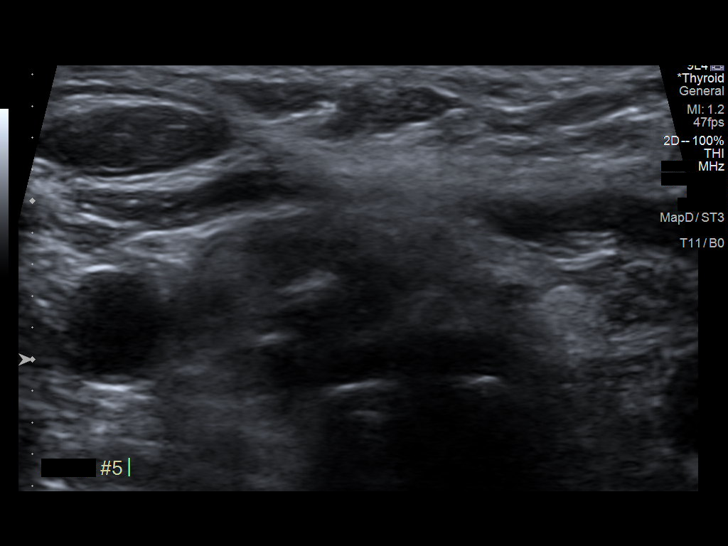
[im 12/13]
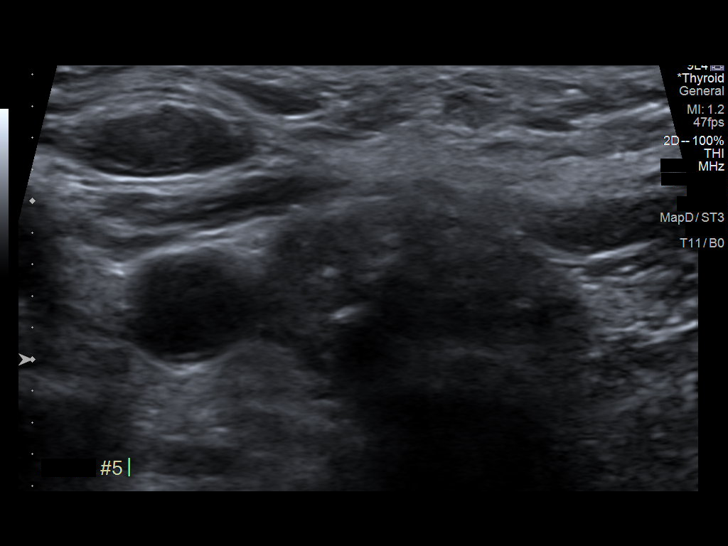
[im 13/13]
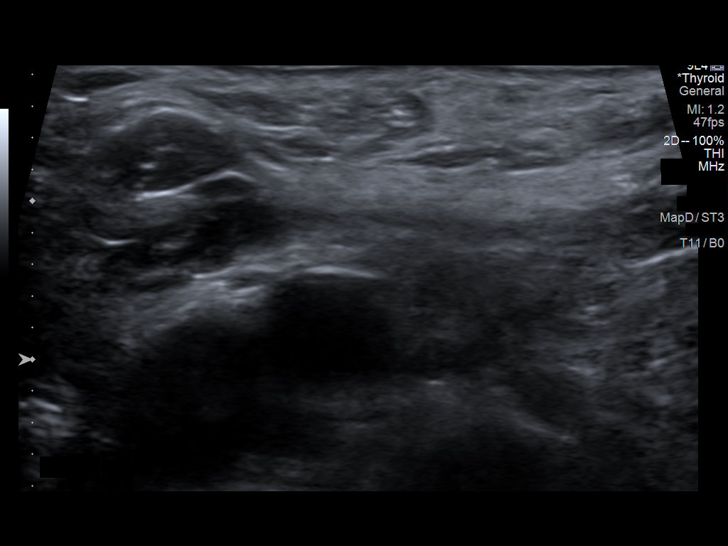

[13 of 13 positions shown; findings below may reference images not displayed]

Pre-procedural ultrasound scanning demonstrated similar size and
appearance of 2.9 cm TR-4 isthmus nodule. Imaging review
demonstrated and interval change in appearance with
macrocalcification of the isthmus nodule since 8044 comparison,
necessitating biopsy.

The procedure was planned. The neck was prepped in the usual sterile
fashion, and a sterile drape was applied covering the operative
field. A timeout was performed prior to the initiation of the
procedure. Local anesthesia was provided with 1% lidocaine.

Under direct ultrasound guidance, 5 FNA biopsies were performed of
the isthmus nodule with a 25 gauge needle. Multiple ultrasound
images were saved for procedural documentation purposes. The samples
were prepared and submitted to pathology.

Limited post procedural scanning was negative for hematoma or
additional complication. Dressings were placed. The patient
tolerated the above procedures procedure well without immediate
postprocedural complication.
FINDINGS: Nodule reference number based on prior diagnostic ultrasound: #1

Maximum size: 2.9 cm

Location: Isthmus;

ACR TI-RADS risk category: TR 4

Reason for biopsy: meets other recommendations, change in appearance
with new suspicious features

Ultrasound imaging confirms appropriate placement of the needles
within the thyroid nodule.
IMPRESSION: Success ultrasound-guided fine needle aspiration of 2.9 cm, TR-4
isthmus nodule.

## 2022-11-24 DIAGNOSIS — Z01419 Encounter for gynecological examination (general) (routine) without abnormal findings: Secondary | ICD-10-CM | POA: Diagnosis not present

## 2022-11-24 DIAGNOSIS — Z6825 Body mass index (BMI) 25.0-25.9, adult: Secondary | ICD-10-CM | POA: Diagnosis not present

## 2022-11-24 DIAGNOSIS — Z1231 Encounter for screening mammogram for malignant neoplasm of breast: Secondary | ICD-10-CM | POA: Diagnosis not present

## 2023-02-16 DIAGNOSIS — H04123 Dry eye syndrome of bilateral lacrimal glands: Secondary | ICD-10-CM | POA: Diagnosis not present

## 2023-02-16 DIAGNOSIS — Z961 Presence of intraocular lens: Secondary | ICD-10-CM | POA: Diagnosis not present

## 2023-08-04 DIAGNOSIS — D165 Benign neoplasm of lower jaw bone: Secondary | ICD-10-CM | POA: Diagnosis not present

## 2023-09-23 DIAGNOSIS — R053 Chronic cough: Secondary | ICD-10-CM | POA: Diagnosis not present

## 2023-10-10 DIAGNOSIS — S81812A Laceration without foreign body, left lower leg, initial encounter: Secondary | ICD-10-CM | POA: Diagnosis not present

## 2024-02-17 DIAGNOSIS — Z01419 Encounter for gynecological examination (general) (routine) without abnormal findings: Secondary | ICD-10-CM | POA: Diagnosis not present

## 2024-02-17 DIAGNOSIS — Z6826 Body mass index (BMI) 26.0-26.9, adult: Secondary | ICD-10-CM | POA: Diagnosis not present

## 2024-02-22 DIAGNOSIS — H04123 Dry eye syndrome of bilateral lacrimal glands: Secondary | ICD-10-CM | POA: Diagnosis not present

## 2024-02-22 DIAGNOSIS — Z961 Presence of intraocular lens: Secondary | ICD-10-CM | POA: Diagnosis not present

## 2024-02-22 DIAGNOSIS — Z1231 Encounter for screening mammogram for malignant neoplasm of breast: Secondary | ICD-10-CM | POA: Diagnosis not present

## 2024-02-27 DIAGNOSIS — Z77011 Contact with and (suspected) exposure to lead: Secondary | ICD-10-CM | POA: Diagnosis not present

## 2024-06-06 ENCOUNTER — Other Ambulatory Visit: Payer: Self-pay

## 2024-06-06 ENCOUNTER — Ambulatory Visit: Payer: Self-pay | Admitting: Family Medicine

## 2024-06-06 ENCOUNTER — Encounter: Payer: Self-pay | Admitting: Family Medicine

## 2024-06-06 ENCOUNTER — Ambulatory Visit

## 2024-06-06 VITALS — BP 108/74 | HR 64 | Ht 64.0 in | Wt 159.0 lb

## 2024-06-06 DIAGNOSIS — M5416 Radiculopathy, lumbar region: Secondary | ICD-10-CM | POA: Diagnosis not present

## 2024-06-06 DIAGNOSIS — M25551 Pain in right hip: Secondary | ICD-10-CM

## 2024-06-06 DIAGNOSIS — M549 Dorsalgia, unspecified: Secondary | ICD-10-CM | POA: Diagnosis not present

## 2024-06-06 DIAGNOSIS — M4807 Spinal stenosis, lumbosacral region: Secondary | ICD-10-CM | POA: Diagnosis not present

## 2024-06-06 DIAGNOSIS — M48061 Spinal stenosis, lumbar region without neurogenic claudication: Secondary | ICD-10-CM | POA: Diagnosis not present

## 2024-06-06 DIAGNOSIS — M47816 Spondylosis without myelopathy or radiculopathy, lumbar region: Secondary | ICD-10-CM | POA: Diagnosis not present

## 2024-06-06 MED ORDER — METHYLPREDNISOLONE ACETATE 80 MG/ML IJ SUSP
80.0000 mg | Freq: Once | INTRAMUSCULAR | Status: AC
  Administered 2024-06-06: 80 mg via INTRAMUSCULAR

## 2024-06-06 MED ORDER — GABAPENTIN 100 MG PO CAPS: 200.0000 mg | ORAL_CAPSULE | Freq: Every day | ORAL | 0 refills | Status: AC

## 2024-06-06 MED ORDER — KETOROLAC TROMETHAMINE 60 MG/2ML IM SOLN
60.0000 mg | Freq: Once | INTRAMUSCULAR | Status: AC
  Administered 2024-06-06: 60 mg via INTRAMUSCULAR

## 2024-06-06 NOTE — Assessment & Plan Note (Signed)
 Unfortunately patient is having more of a lumbar radiculopathy.  This seems to be on the right side.  This seems to correspond with the L4 nerve root.  And some weakness with Trendelenburg noted.  Given Toradol and Depo-Medrol injections today.  Discussed gabapentin at night with patient not responding well to a muscle relaxer has stayed away from anti-inflammatories long-term secondary to potential hives.  Patient knows if no significant improvement advanced imaging would be warranted.  Expecting patient noted to do well with conservative therapy and given home exercises today

## 2024-06-06 NOTE — Progress Notes (Signed)
 Alicia [facility]borne [facility] [address] [place] [address] Phone: [phone] Subjective:   Alicia Osborne, am serving as a scribe for Dr. Arthea Osborne.  I'm seeing this patient by the request  of:  Alicia Lombard, MD  CC: buttocks pain  YEP:Dlagzrupcz  Alicia Osborne is a 63 y.o. female coming in with complaint of R glute pain for the past 2.5 weeks. Patient states that she feels that she injured herself running. She said she has been able to do yardwork and other DIY projects around her house but sitting increases her pain. Has tried Aleve , Tramadol, and Flexeril .      Seen another provider in 2022 for what appeared to be more of a greater trochanteric pain syndrome.  Went undergone shockwave therapy at that time.  Past Medical History:  Diagnosis Date   Pulmonary embolism (HCC)    Past Surgical History:  Procedure Laterality Date   ABDOMINAL HYSTERECTOMY     CESAREAN SECTION     Social History   Socioeconomic History   Marital status: Married    Spouse name: Not on file   Number of children: Not on file   Years of education: Not on file   Highest education level: Not on file  Occupational History   Not on file  Tobacco Use   Smoking status: Never   Smokeless tobacco: Never  Substance and Sexual Activity   Alcohol use: Yes   Drug use: No   Sexual activity: Not on file  Other Topics Concern   Not on file  Social History Narrative   Not on file   Social Drivers of Health   Financial Resource Strain: Not on file  Food Insecurity: Not on file  Transportation Needs: Not on file  Physical Activity: Not on file  Stress: Not on file  Social Connections: Not on file   Allergies  Allergen Reactions   Aspirin Other (See Comments)    Patient is on coumadin   Hydromorphone Hcl Itching   Ibuprofen     REACTION: hives   Family History  Problem Relation Age of Onset   CAD Mother    Lung cancer Father      Current Outpatient  Medications (Cardiovascular):    rosuvastatin (CRESTOR) 10 MG tablet, Take 10 mg by mouth daily.   triamterene-hydrochlorothiazide (MAXZIDE-25) 37.5-25 MG tablet, Take 1 tablet by mouth daily.  Current Outpatient Medications (Respiratory):    cetirizine (ZYRTEC) 10 MG tablet, Take 10 mg by mouth daily.  Current Outpatient Medications (Analgesics):    naproxen  (NAPROSYN ) 500 MG tablet, Take 1 tablet (500 mg total) by mouth 2 (two) times daily.   Current Outpatient Medications (Other):    Cholecalciferol (D3-1000 PO), Take 1 capsule by mouth daily.   gabapentin (NEURONTIN) 100 MG capsule, Take 2 capsules (200 mg total) by mouth at bedtime.   multivitamin-lutein (OCUVITE-LUTEIN) CAPS capsule, Take 1 capsule by mouth daily.   zolpidem (AMBIEN) 5 MG tablet, Take 1 tablet by mouth daily.   Reviewed prior external information including notes and imaging from  primary care provider As well as notes that were available from care everywhere and other healthcare systems.  Past medical history, social, surgical and family history all reviewed in electronic medical record.  No pertanent information unless stated regarding to the chief complaint.   Review of Systems:  No headache, visual changes, nausea, vomiting, diarrhea, constipation, dizziness, abdominal pain, skin rash, fevers, chills, night sweats, weight loss, swollen lymph nodes, body aches,  joint swelling, chest pain, shortness of breath, mood changes. POSITIVE muscle aches  Objective  Blood pressure 108/74, pulse 64, height 5' 4 (1.626 m), weight 159 lb (72.1 kg), SpO2 97%.   General: No apparent distress alert and oriented x3 mood and affect normal, dressed appropriately.  HEENT: Pupils equal, extraocular movements intact  Respiratory: Patient's speak in full sentences and does not appear short of breath  Cardiovascular: No lower extremity edema, non tender, no erythema  On exam today patient does have a mild antalgic gait noted.   Positive straight leg test with some radicular symptoms in the L4 nerve root distribution.  Some good range of motion though otherwise and no significant weakness with DTRs in tact.  Patient has a negative FABER test noted.    Impression and Recommendations:     The above documentation has been reviewed and is accurate and complete Alicia Osborne M Alicia Hukill, DO

## 2024-06-06 NOTE — Patient Instructions (Addendum)
 Xray today Cocktail today Gabapentin 200mg  Do prescribed exercises at least 3x a week  See you again in 2-3 months

## 2024-06-08 ENCOUNTER — Ambulatory Visit: Payer: Self-pay | Admitting: Family Medicine

## 2024-06-08 ENCOUNTER — Telehealth: Payer: Self-pay | Admitting: Family Medicine

## 2024-06-08 NOTE — Telephone Encounter (Signed)
 Patient called to follow back up with Dr Claudene. She is still in quite a lot of pain. She has not been taking any medication for pain to see what she could handle but feel like she needs something. Please advise on what she can take or next steps.

## 2024-06-08 NOTE — Telephone Encounter (Signed)
 Sent My-Chart message
